# Patient Record
Sex: Female | Born: 2000 | Race: White | Hispanic: No | Smoking: Former smoker
Health system: Southern US, Community
[De-identification: ages and names within clinical notes are randomized; demographics above are authoritative.]

## PROBLEM LIST (undated history)

## (undated) DIAGNOSIS — R000 Tachycardia, unspecified: Secondary | ICD-10-CM

## (undated) DIAGNOSIS — O139 Gestational [pregnancy-induced] hypertension without significant proteinuria, unspecified trimester: Secondary | ICD-10-CM

## (undated) DIAGNOSIS — Z8759 Personal history of other complications of pregnancy, childbirth and the puerperium: Secondary | ICD-10-CM

## (undated) DIAGNOSIS — L309 Dermatitis, unspecified: Secondary | ICD-10-CM

## (undated) HISTORY — DX: Personal history of other complications of pregnancy, childbirth and the puerperium: Z87.59

## (undated) HISTORY — DX: Gestational (pregnancy-induced) hypertension without significant proteinuria, unspecified trimester: O13.9

---
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
## 2019-01-09 DIAGNOSIS — Z3201 Encounter for pregnancy test, result positive: Secondary | ICD-10-CM | POA: Diagnosis not present

## 2019-02-06 DIAGNOSIS — Z3A09 9 weeks gestation of pregnancy: Secondary | ICD-10-CM | POA: Diagnosis not present

## 2019-02-06 DIAGNOSIS — Z3689 Encounter for other specified antenatal screening: Secondary | ICD-10-CM | POA: Diagnosis not present

## 2019-02-06 DIAGNOSIS — N912 Amenorrhea, unspecified: Secondary | ICD-10-CM | POA: Diagnosis not present

## 2019-02-06 DIAGNOSIS — Z3201 Encounter for pregnancy test, result positive: Secondary | ICD-10-CM | POA: Diagnosis not present

## 2019-02-06 DIAGNOSIS — Z3682 Encounter for antenatal screening for nuchal translucency: Secondary | ICD-10-CM | POA: Diagnosis not present

## 2019-02-06 DIAGNOSIS — Z3401 Encounter for supervision of normal first pregnancy, first trimester: Secondary | ICD-10-CM | POA: Diagnosis not present

## 2019-02-06 LAB — OB RESULTS CONSOLE ANTIBODY SCREEN: Antibody Screen: NEGATIVE

## 2019-02-06 LAB — OB RESULTS CONSOLE VARICELLA ZOSTER ANTIBODY, IGG: Varicella: IMMUNE

## 2019-02-06 LAB — OB RESULTS CONSOLE RUBELLA ANTIBODY, IGM: Rubella: IMMUNE

## 2019-02-06 LAB — OB RESULTS CONSOLE GC/CHLAMYDIA
Chlamydia: NEGATIVE
Gonorrhea: NEGATIVE

## 2019-02-06 LAB — OB RESULTS CONSOLE HIV ANTIBODY (ROUTINE TESTING): HIV: NONREACTIVE

## 2019-02-06 LAB — OB RESULTS CONSOLE ABO/RH: RH Type: POSITIVE

## 2019-02-06 LAB — OB RESULTS CONSOLE HEPATITIS B SURFACE ANTIGEN: Hepatitis B Surface Ag: NEGATIVE

## 2019-03-06 DIAGNOSIS — Z3A13 13 weeks gestation of pregnancy: Secondary | ICD-10-CM | POA: Diagnosis not present

## 2019-03-06 DIAGNOSIS — Z3682 Encounter for antenatal screening for nuchal translucency: Secondary | ICD-10-CM | POA: Diagnosis not present

## 2019-03-06 DIAGNOSIS — Z23 Encounter for immunization: Secondary | ICD-10-CM | POA: Diagnosis not present

## 2019-04-03 DIAGNOSIS — Z3A18 18 weeks gestation of pregnancy: Secondary | ICD-10-CM | POA: Diagnosis not present

## 2019-04-14 DIAGNOSIS — Z36 Encounter for antenatal screening for chromosomal anomalies: Secondary | ICD-10-CM | POA: Diagnosis not present

## 2019-04-14 DIAGNOSIS — Z3A19 19 weeks gestation of pregnancy: Secondary | ICD-10-CM | POA: Diagnosis not present

## 2019-04-14 DIAGNOSIS — Z363 Encounter for antenatal screening for malformations: Secondary | ICD-10-CM | POA: Diagnosis not present

## 2019-06-13 NOTE — L&D Delivery Note (Signed)
OB/GYN Faculty Practice Delivery Note  Kendra Rose is a 19 y.o. G1P0 s/p SVD at [redacted]w[redacted]d. She was admitted for IOL for PreE with severe features.   AROM: 13h 48m with clear/white fluid GBS Status:  --Theda Sers (03/02 1426) Maximum Maternal Temperature: 98.4 F  Labor Progress: . Patient presented to L&D for IOL for PreE with severe features. Initial SVE: 1cm. Labor course was uncomplicated. She then progressed to complete.   Delivery Date/Time: 08/21/2019 @ 0145  Delivery: Called to room and patient was complete and pushing. Head position was LOA and delivered with ease over the perineum. No nuchal cord present. Shoulder and body delivered in usual fashion. Infant with spontaneous cry, placed on mother's abdomen, dried and stimulated. Cord clamped x 2 after 1-minute delay, and cut by FoB. Cord blood drawn. Placenta delivered spontaneously with gentle cord traction. Pitocin was started and fundal massage continued to produce brisk, frank blood. Misoprostol 400mg  rectal and 400mg  buccal were given. Code hemorrhage was called. She then received TXA, hemabate, and lomotil. Dr at bedside for manual exploration of the uterine cavity and produced some large clots from the lower uterine segment. After all of this and continued fundal massage/pressure, the bleeding was stopped with an EBL of ~1000cc. Labia, perineum, vagina, and cervix inspected and significant for no lacerations. Baby Weight: 3000g  Cord: central insertion, 3 vessel Placenta: Sent to L&D Complications: PPH Lacerations: None EBL: 1000cc Analgesia: Epidural   Infant: APGAR (1 MIN): 5  APGAR (5 MINS): 8 Required blow by oxygen due to amniotic fluid aspiration and was transferred to the NICU.   MD, PGY-1 OBGYN Faculty Teaching Service  08/21/2019, 2:57 AM

## 2019-06-23 ENCOUNTER — Telehealth: Payer: Self-pay | Admitting: Advanced Practice Midwife

## 2019-06-23 DIAGNOSIS — Z3400 Encounter for supervision of normal first pregnancy, unspecified trimester: Secondary | ICD-10-CM | POA: Insufficient documentation

## 2019-06-23 NOTE — Telephone Encounter (Signed)
Called patient regarding appointment scheduled in our office and advised to come alone to the visit, however, a support person, over age 20, may accompany her to appointment if assistance is needed for safety or care concerns. Otherwise, support persons should remain outside until the visit is complete.   Prescreen questions asked: 1. Any of the following symptoms of COVID such as chills, fever, cough, shortness of breath, muscle pain, diarrhea, rash, vomiting, abdominal pain, red eye, weakness, bruising, bleeding, joint pain, loss of taste or smell, a severe headache, sore throat, fatigue 2. Any exposure to anyone suspected or confirmed of having COVID-19 3. Awaiting test results for COVID-19  Also,to keep you safe, please use the provided hand sanitizer when you enter the office. We are asking everyone in the office to wear a mask to help prevent the spread of germs. If you have a mask of your own, please wear it to your appointment, if not, we are happy to provide one for you.  Thank you for understanding and your cooperation.    [facility] Staff      

## 2019-06-24 ENCOUNTER — Encounter: Payer: Self-pay | Admitting: Advanced Practice Midwife

## 2019-06-24 ENCOUNTER — Ambulatory Visit: Payer: Medicaid Other | Admitting: *Deleted

## 2019-06-24 ENCOUNTER — Ambulatory Visit (INDEPENDENT_AMBULATORY_CARE_PROVIDER_SITE_OTHER): Payer: Medicaid Other | Admitting: Advanced Practice Midwife

## 2019-06-24 ENCOUNTER — Other Ambulatory Visit: Payer: Self-pay

## 2019-06-24 ENCOUNTER — Other Ambulatory Visit: Payer: Medicaid Other

## 2019-06-24 VITALS — BP 126/80 | HR 86 | Ht 64.0 in | Wt 204.0 lb

## 2019-06-24 DIAGNOSIS — Z131 Encounter for screening for diabetes mellitus: Secondary | ICD-10-CM

## 2019-06-24 DIAGNOSIS — Z3402 Encounter for supervision of normal first pregnancy, second trimester: Secondary | ICD-10-CM

## 2019-06-24 DIAGNOSIS — Z3403 Encounter for supervision of normal first pregnancy, third trimester: Secondary | ICD-10-CM

## 2019-06-24 DIAGNOSIS — Z1389 Encounter for screening for other disorder: Secondary | ICD-10-CM

## 2019-06-24 DIAGNOSIS — Z331 Pregnant state, incidental: Secondary | ICD-10-CM

## 2019-06-24 DIAGNOSIS — Z3A29 29 weeks gestation of pregnancy: Secondary | ICD-10-CM | POA: Diagnosis not present

## 2019-06-24 MED ORDER — BLOOD PRESSURE MONITOR MISC: 0 refills | Status: DC

## 2019-06-24 NOTE — Patient Instructions (Signed)
Manual Meier, I greatly value your feedback.  If you receive a survey following your visit with [place] today, we appreciate you taking the time to fill it out.  Thanks, Philipp Deputy, CNM  [facility]ITAL HAS MOVED!!! It is now [facility] at [facility] ([address] [place], [place] [address]) Entrance located off of [address] Free 24/7 valet parking   Nausea & Vomiting  Have saltine crackers or pretzels by your bed and eat a few bites before you raise your head out of bed in the morning  Eat small frequent meals throughout the day instead of large meals  Drink plenty of fluids throughout the day to stay hydrated, just don't drink a lot of fluids with your meals.  This can make your stomach fill up faster making you feel sick  Do not brush your teeth right after you eat  Products with real ginger are good for nausea, like ginger ale and ginger hard candy Make sure it says made with real ginger!  Sucking on sour candy like lemon heads is also good for nausea  If your prenatal vitamins make you nauseated, take them at night so you will sleep through the nausea  Sea Bands  If you feel like you need medicine for the nausea & vomiting please let us know  If you are unable to keep any fluids or food down please let us know   Constipation  Drink plenty of fluid, preferably water, throughout the day  Eat foods high in fiber such as fruits, vegetables, and grains  Exercise, such as walking, is a good way to keep your bowels regular  Drink warm fluids, especially warm prune juice, or decaf coffee  Eat a 1/2 cup of real oatmeal (not instant), 1/2 cup applesauce, and 1/2-1 cup warm prune juice every day  If needed, you may take Colace (docusate sodium) stool softener once or twice a day to help keep the stool soft.   If you still are having problems with constipation, you may take Miralax once daily as needed to help keep your bowels regular.   Home Blood Pressure  Monitoring for Patients   Your provider has recommended that you check your blood pressure (BP) at least once a week at home. If you do not have a blood pressure cuff at home, one will be provided for you. Contact your provider if you have not received your monitor within 1 week.   Helpful Tips for Accurate Home Blood Pressure Checks  . Don't smoke, exercise, or drink caffeine 30 minutes before checking your BP . Use the restroom before checking your BP (a full bladder can raise your pressure) . Relax in a comfortable upright chair . Feet on the ground . Left arm resting comfortably on a flat surface at the level of your heart . Legs uncrossed . Back supported . Sit quietly and don't talk . Place the cuff on your bare arm . Adjust snuggly, so that only two fingertips can fit between your skin and the top of the cuff . Check 2 readings separated by at least one minute . Keep a log of your BP readings . For a visual, please reference this diagram: http://ccnc.care/bpdiagram  Provider Name: [facility]     Phone: [phone]  Zone 1: ALL CLEAR  Continue to monitor your symptoms:  . BP reading is less than 140 (top number) or less than 90 (bottom number)  . No right upper stomach pain .  No headaches or seeing spots . No feeling nauseated or throwing up . No swelling in face and hands  Zone 2: CAUTION Call your doctor's office for any of the following:  . BP reading is greater than 140 (top number) or greater than 90 (bottom number)  . Stomach pain under your ribs in the middle or right side . Headaches or seeing spots . Feeling nauseated or throwing up . Swelling in face and hands  Zone 3: EMERGENCY  Seek immediate medical care if you have any of the following:  . BP reading is greater than160 (top number) or greater than 110 (bottom number) . Severe headaches not improving with Tylenol . Serious difficulty catching your breath . Any worsening symptoms from Zone 2     First Trimester of Pregnancy The first trimester of pregnancy is from week 1 until the end of week 12 (months 1 through 3). A week after a sperm fertilizes an egg, the egg will implant on the wall of the uterus. This embryo will begin to develop into a baby. Genes from you and your partner are forming the baby. The female genes determine whether the baby is a boy or a girl. At 6-8 weeks, the eyes and face are formed, and the heartbeat can be seen on ultrasound. At the end of 12 weeks, all the baby's organs are formed.  Now that you are pregnant, you will want to do everything you can to have a healthy baby. Two of the most important things are to get good prenatal care and to follow your health care provider's instructions. Prenatal care is all the medical care you receive before the baby's birth. This care will help prevent, find, and treat any problems during the pregnancy and childbirth. BODY CHANGES Your body goes through many changes during pregnancy. The changes vary from woman to woman.   You may gain or lose a couple of pounds at first.  You may feel sick to your stomach (nauseous) and throw up (vomit). If the vomiting is uncontrollable, call your health care provider.  You may tire easily.  You may develop headaches that can be relieved by medicines approved by your health care provider.  You may urinate more often. Painful urination may mean you have a bladder infection.  You may develop heartburn as a result of your pregnancy.  You may develop constipation because certain hormones are causing the muscles that push waste through your intestines to slow down.  You may develop hemorrhoids or swollen, bulging veins (varicose veins).  Your breasts may begin to grow larger and become tender. Your nipples may stick out more, and the tissue that surrounds them (areola) may become darker.  Your gums may bleed and may be sensitive to brushing and flossing.  Dark spots or blotches (chloasma,  mask of pregnancy) may develop on your face. This will likely fade after the baby is born.  Your menstrual periods will stop.  You may have a loss of appetite.  You may develop cravings for certain kinds of food.  You may have changes in your emotions from day to day, such as being excited to be pregnant or being concerned that something may go wrong with the pregnancy and baby.  You may have more vivid and strange dreams.  You may have changes in your hair. These can include thickening of your hair, rapid growth, and changes in texture. Some women also have hair loss during or after pregnancy, or hair that feels dry  or thin. Your hair will most likely return to normal after your baby is born. WHAT TO EXPECT AT YOUR PRENATAL VISITS During a routine prenatal visit:  You will be weighed to make sure you and the baby are growing normally.  Your blood pressure will be taken.  Your abdomen will be measured to track your baby's growth.  The fetal heartbeat will be listened to starting around week 10 or 12 of your pregnancy.  Test results from any previous visits will be discussed. Your health care provider may ask you:  How you are feeling.  If you are feeling the baby move.  If you have had any abnormal symptoms, such as leaking fluid, bleeding, severe headaches, or abdominal cramping.  If you have any questions. Other tests that may be performed during your first trimester include:  Blood tests to find your blood type and to check for the presence of any previous infections. They will also be used to check for low iron levels (anemia) and Rh antibodies. Later in the pregnancy, blood tests for diabetes will be done along with other tests if problems develop.  Urine tests to check for infections, diabetes, or protein in the urine.  An ultrasound to confirm the proper growth and development of the baby.  An amniocentesis to check for possible genetic problems.  Fetal screens for  spina bifida and Down syndrome.  You may need other tests to make sure you and the baby are doing well. HOME CARE INSTRUCTIONS  Medicines  Follow your health care provider's instructions regarding medicine use. Specific medicines may be either safe or unsafe to take during pregnancy.  Take your prenatal vitamins as directed.  If you develop constipation, try taking a stool softener if your health care provider approves. Diet  Eat regular, well-balanced meals. Choose a variety of foods, such as meat or vegetable-based protein, fish, milk and low-fat dairy products, vegetables, fruits, and whole grain breads and cereals. Your health care provider will help you determine the amount of weight gain that is right for you.  Avoid raw meat and uncooked cheese. These carry germs that can cause birth defects in the baby.  Eating four or five small meals rather than three large meals a day may help relieve nausea and vomiting. If you start to feel nauseous, eating a few soda crackers can be helpful. Drinking liquids between meals instead of during meals also seems to help nausea and vomiting.  If you develop constipation, eat more high-fiber foods, such as fresh vegetables or fruit and whole grains. Drink enough fluids to keep your urine clear or pale yellow. Activity and Exercise  Exercise only as directed by your health care provider. Exercising will help you:  Control your weight.  Stay in shape.  Be prepared for labor and delivery.  Experiencing pain or cramping in the lower abdomen or low back is a good sign that you should stop exercising. Check with your health care provider before continuing normal exercises.  Try to avoid standing for long periods of time. Move your legs often if you must stand in one place for a long time.  Avoid heavy lifting.  Wear low-heeled shoes, and practice good posture.  You may continue to have sex unless your health care provider directs you  otherwise. Relief of Pain or Discomfort  Wear a good support bra for breast tenderness.    Take warm sitz baths to soothe any pain or discomfort caused by hemorrhoids. Use hemorrhoid cream if your  health care provider approves.    Rest with your legs elevated if you have leg cramps or low back pain.  If you develop varicose veins in your legs, wear support hose. Elevate your feet for 15 minutes, 3-4 times a day. Limit salt in your diet. Prenatal Care  Schedule your prenatal visits by the twelfth week of pregnancy. They are usually scheduled monthly at first, then more often in the last 2 months before delivery.  Write down your questions. Take them to your prenatal visits.  Keep all your prenatal visits as directed by your health care provider. Safety  Wear your seat belt at all times when driving.  Make a list of emergency phone numbers, including numbers for family, friends, the hospital, and police and fire departments. General Tips  Ask your health care provider for a referral to a local prenatal education class. Begin classes no later than at the beginning of month 6 of your pregnancy.  Ask for help if you have counseling or nutritional needs during pregnancy. Your health care provider can offer advice or refer you to specialists for help with various needs.  Do not use hot tubs, steam rooms, or saunas.  Do not douche or use tampons or scented sanitary pads.  Do not cross your legs for long periods of time.  Avoid cat litter boxes and soil used by cats. These carry germs that can cause birth defects in the baby and possibly loss of the fetus by miscarriage or stillbirth.  Avoid all smoking, herbs, alcohol, and medicines not prescribed by your health care provider. Chemicals in these affect the formation and growth of the baby.  Schedule a dentist appointment. At home, brush your teeth with a soft toothbrush and be gentle when you floss. SEEK MEDICAL CARE IF:   You have  dizziness.  You have mild pelvic cramps, pelvic pressure, or nagging pain in the abdominal area.  You have persistent nausea, vomiting, or diarrhea.  You have a bad smelling vaginal discharge.  You have pain with urination.  You notice increased swelling in your face, hands, legs, or ankles. SEEK IMMEDIATE MEDICAL CARE IF:   You have a fever.  You are leaking fluid from your vagina.  You have spotting or bleeding from your vagina.  You have severe abdominal cramping or pain.  You have rapid weight gain or loss.  You vomit blood or material that looks like coffee grounds.  You are exposed to [place] measles and have never had them.  You are exposed to fifth disease or chickenpox.  You develop a severe headache.  You have shortness of breath.  You have any kind of trauma, such as from a fall or a car accident. Document Released: 05/23/2001 Document Revised: 10/13/2013 Document Reviewed: 04/08/2013 [facility] Patient Information 2015 [place], [place]. This information is not intended to replace advice given to you by your health care provider. Make sure you discuss any questions you have with your health care provider.  Coronavirus (COVID-19) Are you at risk?  Are you at risk for the Coronavirus (COVID-19)?  To be considered HIGH RISK for Coronavirus (COVID-19), you have to meet the following criteria:  . Traveled to [place], [place], [place], [place] or [place]; or in the [place] to [place], [place], [place], or [place]; and have fever, cough, and shortness of breath within the last 2 weeks of travel OR . Been in close contact with a person diagnosed with COVID-19 within the last 2 weeks and  have fever, cough, and shortness of breath . IF YOU DO NOT MEET THESE CRITERIA, YOU ARE CONSIDERED LOW RISK FOR COVID-19.  What to do if you are HIGH RISK for COVID-19?  Marland Kitchen If you are having a medical emergency, call 911. . Seek medical care right away. Before you go to a  doctor's office, urgent care or emergency department, call ahead and tell them about your recent travel, contact with someone diagnosed with COVID-19, and your symptoms. You should receive instructions from your physician's office regarding next steps of care.  . When you arrive at healthcare provider, tell the healthcare staff immediately you have returned from visiting [place], [place], [place], [place] or [place]; or traveled in the [place] to [place], [place], [place], or [place]; in the last two weeks or you have been in close contact with a person diagnosed with COVID-19 in the last 2 weeks.   . Tell the health care staff about your symptoms: fever, cough and shortness of breath. . After you have been seen by a medical provider, you will be either: o Tested for (COVID-19) and discharged home on quarantine except to seek medical care if symptoms worsen, and asked to  - Stay home and avoid contact with others until you get your results (4-5 days)  - Avoid travel on public transportation if possible (such as bus, train, or airplane) or o Sent to the Emergency Department by EMS for evaluation, COVID-19 testing, and possible admission depending on your condition and test results.  What to do if you are LOW RISK for COVID-19?  Reduce your risk of any infection by using the same precautions used for avoiding the common cold or flu:  Marland Kitchen Wash your hands often with soap and warm water for at least 20 seconds.  If soap and water are not readily available, use an alcohol-based hand sanitizer with at least 60% alcohol.  . If coughing or sneezing, cover your mouth and nose by coughing or sneezing into the elbow areas of your shirt or coat, into a tissue or into your sleeve (not your hands). . Avoid shaking hands with others and consider head nods or verbal greetings only. . Avoid touching your eyes, nose, or mouth with unwashed hands.  . Avoid close contact with people who are sick. . Avoid  places or events with large numbers of people in one location, like concerts or sporting events. . Carefully consider travel plans you have or are making. . If you are planning any travel outside or inside the [place], visit the CDC's Travelers' Health webpage for the latest health notices. . If you have some symptoms but not all symptoms, continue to monitor at home and seek medical attention if your symptoms worsen. . If you are having a medical emergency, call 911.   [place] / e-Visit: [email or web address]         [facility]: [facility]: [id number]                   [facility]: [phone]

## 2019-06-24 NOTE — Progress Notes (Signed)
INITIAL OBSTETRICAL VISIT Patient name: Kendra Rose MRN [id number]  Date of birth: December 11, 2000 Chief Complaint:   Initial Prenatal Visit (transfer from [facility] [place])  History of Present Illness:   Kendra Rose is a 19 y.o. G19P0 Caucasian female at [redacted]w[redacted]d by LMP with an Estimated Date of Delivery: 09/06/19 being seen today for initial obstetrical visit as a tx of care from [place].   Her obstetrical history is significant for prev smoker (stopped with preg).   Today she reports hip discomfort.  Patient's last menstrual period was 11/30/2018. Last pap <21years.  Review of Systems:   Pertinent items are noted in HPI Denies cramping/contractions, leakage of fluid, vaginal bleeding, abnormal vaginal discharge w/ itching/odor/irritation, headaches, visual changes, shortness of breath, chest pain, abdominal pain, severe nausea/vomiting, or problems with urination or bowel movements unless otherwise stated above.  Pertinent History Reviewed:  Reviewed past medical,surgical, social, obstetrical and family history.  Reviewed problem list, medications and allergies. OB History  Gravida Para Term Preterm AB Living  1            SAB TAB Ectopic Multiple Live Births               # Outcome Date GA Lbr Len/2nd Weight Sex Delivery Anes PTL Lv  1 Current            Physical Assessment:   Vitals:   06/24/19 0954 06/24/19 0959  BP: 126/80   Pulse: 86   Weight: 204 lb (92.5 kg)   Height:  5\' 4"  (1.626 m)  Body mass index is 35.02 kg/m.       Physical Examination:  General appearance - well appearing, and in no distress  Mental status - alert, oriented to person, place, and time  Psych:  She has a normal mood and affect  Skin - warm and dry, normal color, no suspicious lesions noted  Chest - effort normal, all lung fields clear to auscultation bilaterally  Heart - normal rate and regular rhythm  Abdomen - soft, nontender  Extremities:  No swelling or varicosities noted  Pelvic - exam not  indicated  Thin prep pap is not done   No results found for this or any previous visit (from the past 24 hour(s)).  Assessment & Plan:  1) Low-Risk Pregnancy G1P0 at [redacted]w[redacted]d with an Estimated Date of Delivery: 09/06/19   2) Initial OB visit as a transfer of care> prenatal records reviewed (only concern was +RPR with neg TP)  3) Moving to [place] in Feb> wants to stay [facility] so will either tx to [facility] or [facility] most likely  Meds:  Meds ordered this encounter  Medications  . Blood Pressure Monitor MISC    Sig: For regular home bp monitoring during pregnancy    Dispense:  1 each    Refill:  0    Z34.03    Initial labs obtained Continue prenatal vitamins Reviewed n/v relief measures and warning s/s to report Reviewed recommended weight gain based on pre-gravid BMI Encouraged well-balanced diet Genetic Screening discussed: results reviewed- neg OSB Cystic fibrosis, SMA, Fragile X screening discussed declined Ultrasound discussed; fetal survey: requested CCNC completed>PCM not here, form faxed The nature of [place] for [employer] with multiple MDs and other Advanced Practice Providers was explained to patient; also emphasized that fellows, residents, and students are part of our team. Needs home bp cuff. Check bp weekly, let us know if >140/90.    Follow-up: Return in about 2  weeks (around 07/08/2019) for LROB, in person.   Orders Placed This Encounter  Procedures  . OB RESULTS CONSOLE GC/Chlamydia  . OB RESULTS CONSOLE HIV antibody  . OB RESULTS CONSOLE Rubella Antibody  . OB RESULTS CONSOLE Varicella zoster antibody, IgG  . OB RESULTS CONSOLE Hepatitis B surface antigen  . POC Urinalysis Dipstick OB  . OB RESULTS CONSOLE ABO/Rh  . OB RESULTS CONSOLE Antibody Screen    [name] [facility] 06/24/2019 11:12 AM

## 2019-06-25 LAB — GLUCOSE TOLERANCE, 2 HOURS W/ 1HR
Glucose, 1 hour: 152 mg/dL (ref 65–179)
Glucose, 2 hour: 120 mg/dL (ref 65–152)
Glucose, Fasting: 84 mg/dL (ref 65–91)

## 2019-06-25 LAB — CBC
Hematocrit: 34.2 % (ref 34.0–46.6)
Hemoglobin: 11.2 g/dL (ref 11.1–15.9)
MCH: 27.5 pg (ref 26.6–33.0)
MCHC: 32.7 g/dL (ref 31.5–35.7)
MCV: 84 fL (ref 79–97)
Platelets: 193 10*3/uL (ref 150–450)
RBC: 4.08 x10E6/uL (ref 3.77–5.28)
RDW: 13.1 % (ref 11.7–15.4)
WBC: 12.5 10*3/uL — ABNORMAL HIGH (ref 3.4–10.8)

## 2019-06-25 LAB — ANTIBODY SCREEN: Antibody Screen: NEGATIVE

## 2019-06-25 LAB — HIV ANTIBODY (ROUTINE TESTING W REFLEX): HIV Screen 4th Generation wRfx: NONREACTIVE

## 2019-06-25 LAB — RPR: RPR Ser Ql: NONREACTIVE

## 2019-07-07 ENCOUNTER — Telehealth: Payer: Self-pay | Admitting: Advanced Practice Midwife

## 2019-07-07 NOTE — Telephone Encounter (Signed)
Called patient regarding appointment and the following message was left: ° ° °We have you scheduled for an upcoming appointment at our office. At this time, we are still not allowing visitors during the appointment, however, a support person, over age 20, may accompany you to your appointment if assistance is needed for safety or care concerns. Otherwise, support persons should remain outside until the visit is complete.  ° °We ask if you are sick, have any symptoms of COVID, have had any exposure to anyone suspected or confirmed of having COVID-19, or are awaiting test results for COVID-19, to call our office as we may need to reschedule you for a virtual visit or schedule your appointment for a later date.   ° °Please know we will ask you these questions or similar questions when you arrive for your appointment and understand this is how we are keeping everyone safe.   ° °Also,to keep you safe, please use the provided hand sanitizer when you enter the office. We are asking everyone in the office to wear a mask to help prevent the spread of °germs. If you have a mask of your own, please wear it to your appointment, if not, we are happy to provide one for you. ° °Thank you for understanding and your cooperation.  ° ° °[facility] Staff ° ° ° ° ° °

## 2019-07-08 ENCOUNTER — Encounter: Payer: Self-pay | Admitting: Advanced Practice Midwife

## 2019-07-08 ENCOUNTER — Ambulatory Visit (INDEPENDENT_AMBULATORY_CARE_PROVIDER_SITE_OTHER): Payer: Medicaid Other | Admitting: Advanced Practice Midwife

## 2019-07-08 ENCOUNTER — Other Ambulatory Visit: Payer: Self-pay

## 2019-07-08 VITALS — BP 125/74 | HR 107 | Wt 206.5 lb

## 2019-07-08 DIAGNOSIS — Z1389 Encounter for screening for other disorder: Secondary | ICD-10-CM

## 2019-07-08 DIAGNOSIS — Z331 Pregnant state, incidental: Secondary | ICD-10-CM

## 2019-07-08 DIAGNOSIS — Z3403 Encounter for supervision of normal first pregnancy, third trimester: Secondary | ICD-10-CM

## 2019-07-08 DIAGNOSIS — Z3A31 31 weeks gestation of pregnancy: Secondary | ICD-10-CM

## 2019-07-08 DIAGNOSIS — Z23 Encounter for immunization: Secondary | ICD-10-CM

## 2019-07-08 LAB — POCT URINALYSIS DIPSTICK OB
Glucose, UA: NEGATIVE
Ketones, UA: NEGATIVE
Nitrite, UA: NEGATIVE

## 2019-07-08 NOTE — Progress Notes (Signed)
   LOW-RISK PREGNANCY VISIT Patient name: Kendra Rose MRN [id number]  Date of birth: 09/05/2000 Chief Complaint:   Routine Prenatal Visit  History of Present Illness:   Kendra Rose is a 19 y.o. G1P0 female at [redacted]w[redacted]d with an Estimated Date of Delivery: 09/06/19 being seen today for ongoing management of a low-risk pregnancy.  Today she reports no complaints. Contractions: Not present. Vag. Bleeding: None.  Movement: Present. denies leaking of fluid. Review of Systems:   Pertinent items are noted in HPI Denies abnormal vaginal discharge w/ itching/odor/irritation, headaches, visual changes, shortness of breath, chest pain, abdominal pain, severe nausea/vomiting, or problems with urination or bowel movements unless otherwise stated above. Pertinent History Reviewed:  Reviewed past medical,surgical, social, obstetrical and family history.  Reviewed problem list, medications and allergies. Physical Assessment:   Vitals:   07/08/19 1626  BP: 125/74  Pulse: (!) 107  Weight: 206 lb 8 oz (93.7 kg)  Body mass index is 35.45 kg/m.        Physical Examination:   General appearance: Well appearing, and in no distress  Mental status: Alert, oriented to person, place, and time  Skin: Warm & dry  Cardiovascular: Normal heart rate noted  Respiratory: Normal respiratory effort, no distress  Abdomen: Soft, gravid, nontender  Pelvic: Cervical exam deferred         Extremities: Edema: Trace  Fetal Status: Fetal Heart Rate (bpm): 143 Fundal Height: 30 cm Movement: Present    Results for orders placed or performed in visit on 07/08/19 (from the past 24 hour(s))  POC Urinalysis Dipstick OB   Collection Time: 07/08/19  4:26 PM  Result Value Ref Range   Color, UA     Clarity, UA     Glucose, UA Negative Negative   Bilirubin, UA     Ketones, UA neg    Spec Grav, UA     Blood, UA trace    pH, UA     POC,PROTEIN,UA Trace Negative, Trace, Small (1+), Moderate (2+), Large (3+), 4+   Urobilinogen, UA      Nitrite, UA neg    Leukocytes, UA Trace (A) Negative   Appearance     Odor      Assessment & Plan:  1) Low-risk pregnancy G1P0 at [redacted]w[redacted]d with an Estimated Date of Delivery: 09/06/19   2) Moving to [place] in Feb, will plan on transferring care to another [facility] office (msg to [place])   Meds: No orders of the defined types were placed in this encounter.  Labs/procedures today: none  Plan:  Continue routine obstetrical care   Reviewed: Preterm labor symptoms and general obstetric precautions including but not limited to vaginal bleeding, contractions, leaking of fluid and fetal movement were reviewed in detail with the patient.  All questions were answered. Has home bp cuff.  Check bp weekly, let us know if >140/90.   Follow-up: Return for LROB, MyChart video; 2-3 wks.  Orders Placed This Encounter  Procedures  . Tdap vaccine greater than or equal to 7yo IM  . POC Urinalysis Dipstick OB   [name] [facility] 07/08/2019 4:47 PM

## 2019-07-08 NOTE — Patient Instructions (Signed)
Manual Meier, I greatly value your feedback.  If you receive a survey following your visit with [place] today, we appreciate you taking the time to fill it out.  Thanks, Philipp Deputy, CNM  [facility] HOSPITAL HAS MOVED!!! It is now [facility] at [facility] ([address] [place], [place] [address]) Entrance located off of [address] Free 24/7 valet parking    Go to [email or web address] to register for FREE online childbirth classes   Call the office [phone]) or go to [facility] if:  You begin to have strong, frequent contractions  Your water breaks.  Sometimes it is a big gush of fluid, sometimes it is just a trickle that keeps getting your panties wet or running down your legs  You have vaginal bleeding.  It is normal to have a small amount of spotting if your cervix was checked.   You don't feel your baby moving like normal.  If you don't, get you something to eat and drink and lay down and focus on feeling your baby move.  You should feel at least 10 movements in 2 hours.  If you don't, you should call the office or go to [facility].    Tdap Vaccine  It is recommended that you get the Tdap vaccine during the third trimester of EACH pregnancy to help protect your baby from getting pertussis (whooping cough)  27-36 weeks is the BEST time to do this so that you can pass the protection on to your baby. During pregnancy is better than after pregnancy, but if you are unable to get it during pregnancy it will be offered at the hospital.   You can get this vaccine with [place], at the health department, your family doctor, or some local pharmacies  Everyone who will be around your baby should also be up-to-date on their vaccines before the baby comes. Adults (who are not pregnant) only need 1 dose of Tdap during adulthood.   [place] Pediatricians/Family Doctors:  [facility] [phone]            [facility] [phone]                  [facility] [phone] (usually not accepting new patients unless you have family there already, you are always welcome to call and ask)       [facility] Department [phone]       [facility] Pediatricians/Family Doctors:   [facility]: [phone]  [facility]: [phone]  [facility]: [phone]  [facility] Doctors:   [facility]: [phone]   [facility]: [phone]  [facility] Doctors:  [facility]: [phone]   Home Blood Pressure Monitoring for Patients   Your provider has recommended that you check your blood pressure (BP) at least once a week at home. If you do not have a blood pressure cuff at home, one will be provided for you. Contact your provider if you have not received your monitor within 1 week.   Helpful Tips for Accurate Home Blood Pressure Checks  . Don't smoke, exercise, or drink caffeine 30 minutes before checking your BP . Use the restroom before checking your BP (a full bladder can raise your pressure) . Relax in a comfortable upright chair . Feet on the ground . Left arm resting comfortably on a flat surface at the level of your heart . Legs uncrossed . Back supported . Sit quietly and don't talk .  Place the cuff on your bare arm . Adjust snuggly, so that only two fingertips can fit between your skin and the top of the cuff . Check 2 readings separated by at least one minute . Keep a log of your BP readings . For a visual, please reference this diagram: http://ccnc.care/bpdiagram  Provider Name: [facility]     Phone: [phone]  Zone 1: ALL CLEAR  Continue to monitor your symptoms:  . BP reading is less than 140 (top number) or less than 90 (bottom number)  . No right upper stomach pain . No headaches or seeing spots . No feeling nauseated or throwing up . No swelling in face and  hands  Zone 2: CAUTION Call your doctor's office for any of the following:  . BP reading is greater than 140 (top number) or greater than 90 (bottom number)  . Stomach pain under your ribs in the middle or right side . Headaches or seeing spots . Feeling nauseated or throwing up . Swelling in face and hands  Zone 3: EMERGENCY  Seek immediate medical care if you have any of the following:  . BP reading is greater than160 (top number) or greater than 110 (bottom number) . Severe headaches not improving with Tylenol . Serious difficulty catching your breath . Any worsening symptoms from Zone 2   Third Trimester of Pregnancy The third trimester is from week 29 through week 42, months 7 through 9. The third trimester is a time when the fetus is growing rapidly. At the end of the ninth month, the fetus is about 20 inches in length and weighs 6-10 pounds.  BODY CHANGES Your body goes through many changes during pregnancy. The changes vary from woman to woman.   Your weight will continue to increase. You can expect to gain 25-35 pounds (11-16 kg) by the end of the pregnancy.  You may begin to get stretch marks on your hips, abdomen, and breasts.  You may urinate more often because the fetus is moving lower into your pelvis and pressing on your bladder.  You may develop or continue to have heartburn as a result of your pregnancy.  You may develop constipation because certain hormones are causing the muscles that push waste through your intestines to slow down.  You may develop hemorrhoids or swollen, bulging veins (varicose veins).  You may have pelvic pain because of the weight gain and pregnancy hormones relaxing your joints between the bones in your pelvis. Backaches may result from overexertion of the muscles supporting your posture.  You may have changes in your hair. These can include thickening of your hair, rapid growth, and changes in texture. Some women also have hair loss during  or after pregnancy, or hair that feels dry or thin. Your hair will most likely return to normal after your baby is born.  Your breasts will continue to grow and be tender. A yellow discharge may leak from your breasts called colostrum.  Your belly button may stick out.  You may feel short of breath because of your expanding uterus.  You may notice the fetus "dropping," or moving lower in your abdomen.  You may have a bloody mucus discharge. This usually occurs a few days to a week before labor begins.  Your cervix becomes thin and soft (effaced) near your due date. WHAT TO EXPECT AT YOUR PRENATAL EXAMS  You will have prenatal exams every 2 weeks until week 36. Then, you will have weekly prenatal exams. During  a routine prenatal visit:  You will be weighed to make sure you and the fetus are growing normally.  Your blood pressure is taken.  Your abdomen will be measured to track your baby's growth.  The fetal heartbeat will be listened to.  Any test results from the previous visit will be discussed.  You may have a cervical check near your due date to see if you have effaced. At around 36 weeks, your caregiver will check your cervix. At the same time, your caregiver will also perform a test on the secretions of the vaginal tissue. This test is to determine if a type of bacteria, Group B streptococcus, is present. Your caregiver will explain this further. Your caregiver may ask you:  What your birth plan is.  How you are feeling.  If you are feeling the baby move.  If you have had any abnormal symptoms, such as leaking fluid, bleeding, severe headaches, or abdominal cramping.  If you have any questions. Other tests or screenings that may be performed during your third trimester include:  Blood tests that check for low iron levels (anemia).  Fetal testing to check the health, activity level, and growth of the fetus. Testing is done if you have certain medical conditions or if  there are problems during the pregnancy. FALSE LABOR You may feel small, irregular contractions that eventually go away. These are called Braxton Hicks contractions, or false labor. Contractions may last for hours, days, or even weeks before true labor sets in. If contractions come at regular intervals, intensify, or become painful, it is best to be seen by your caregiver.  SIGNS OF LABOR   Menstrual-like cramps.  Contractions that are 5 minutes apart or less.  Contractions that start on the top of the uterus and spread down to the lower abdomen and back.  A sense of increased pelvic pressure or back pain.  A watery or bloody mucus discharge that comes from the vagina. If you have any of these signs before the 37th week of pregnancy, call your caregiver right away. You need to go to the hospital to get checked immediately. HOME CARE INSTRUCTIONS   Avoid all smoking, herbs, alcohol, and unprescribed drugs. These chemicals affect the formation and growth of the baby.  Follow your caregiver's instructions regarding medicine use. There are medicines that are either safe or unsafe to take during pregnancy.  Exercise only as directed by your caregiver. Experiencing uterine cramps is a good sign to stop exercising.  Continue to eat regular, healthy meals.  Wear a good support bra for breast tenderness.  Do not use hot tubs, steam rooms, or saunas.  Wear your seat belt at all times when driving.  Avoid raw meat, uncooked cheese, cat litter boxes, and soil used by cats. These carry germs that can cause birth defects in the baby.  Take your prenatal vitamins.  Try taking a stool softener (if your caregiver approves) if you develop constipation. Eat more high-fiber foods, such as fresh vegetables or fruit and whole grains. Drink plenty of fluids to keep your urine clear or pale yellow.  Take warm sitz baths to soothe any pain or discomfort caused by hemorrhoids. Use hemorrhoid cream if your  caregiver approves.  If you develop varicose veins, wear support hose. Elevate your feet for 15 minutes, 3-4 times a day. Limit salt in your diet.  Avoid heavy lifting, wear low heal shoes, and practice good posture.  Rest a lot with your legs elevated if you  have leg cramps or low back pain.  Visit your dentist if you have not gone during your pregnancy. Use a soft toothbrush to brush your teeth and be gentle when you floss.  A sexual relationship may be continued unless your caregiver directs you otherwise.  Do not travel far distances unless it is absolutely necessary and only with the approval of your caregiver.  Take prenatal classes to understand, practice, and ask questions about the labor and delivery.  Make a trial run to the hospital.  Pack your hospital bag.  Prepare the baby's nursery.  Continue to go to all your prenatal visits as directed by your caregiver. SEEK MEDICAL CARE IF:  You are unsure if you are in labor or if your water has broken.  You have dizziness.  You have mild pelvic cramps, pelvic pressure, or nagging pain in your abdominal area.  You have persistent nausea, vomiting, or diarrhea.  You have a bad smelling vaginal discharge.  You have pain with urination. SEEK IMMEDIATE MEDICAL CARE IF:   You have a fever.  You are leaking fluid from your vagina.  You have spotting or bleeding from your vagina.  You have severe abdominal cramping or pain.  You have rapid weight loss or gain.  You have shortness of breath with chest pain.  You notice sudden or extreme swelling of your face, hands, ankles, feet, or legs.  You have not felt your baby move in over an hour.  You have severe headaches that do not go away with medicine.  You have vision changes. Document Released: 05/23/2001 Document Revised: 06/03/2013 Document Reviewed: 07/30/2012 [facility] Patient Information 2015 [place], [place]. This information is not intended to replace advice  given to you by your health care provider. Make sure you discuss any questions you have with your health care provider.  PROTECT YOURSELF & YOUR BABY FROM THE FLU! Because you are pregnant, we at [facility], along with the Centers for Disease Control (CDC), recommend that you receive the flu vaccine to protect yourself and your baby from the flu. The flu is more likely to cause severe illness in pregnant women than in women of reproductive age who are not pregnant. Changes in the immune system, heart, and lungs during pregnancy make pregnant women (and women up to two weeks postpartum) more prone to severe illness from flu, including illness resulting in hospitalization. Flu also may be harmful for a pregnant woman's developing baby. A common flu symptom is fever, which may be associated with neural tube defects and other adverse outcomes for a developing baby. Getting vaccinated can also help protect a baby after birth from flu. (Mom passes antibodies onto the developing baby during her pregnancy.)  A Flu Vaccine is the Best Protection Against Flu Getting a flu vaccine is the first and most important step in protecting against flu. Pregnant women should get a flu shot and not the live attenuated influenza vaccine (LAIV), also known as nasal spray flu vaccine. Flu vaccines given during pregnancy help protect both the mother and her baby from flu. Vaccination has been shown to reduce the risk of flu-associated acute respiratory infection in pregnant women by up to one-half. A 2018 study showed that getting a flu shot reduced a pregnant woman's risk of being hospitalized with flu by an average of 40 percent. Pregnant women who get a flu vaccine are also helping to protect their babies from flu illness for the first several months after their birth, when they are too  young to get vaccinated.   A Long Record of Safety for Flu Shots in Pregnant Women Flu shots have been given to millions of pregnant women over  many years with a good safety record. There is a lot of evidence that flu vaccines can be given safely during pregnancy; though these data are limited for the first trimester. The CDC recommends that pregnant women get vaccinated during any trimester of their pregnancy. It is very important for pregnant women to get the flu shot.   Other Preventive Actions In addition to getting a flu shot, pregnant women should take the same everyday preventive actions the CDC recommends of everyone, including covering coughs, washing hands often, and avoiding people who are sick.  Symptoms and Treatment If you get sick with flu symptoms call your doctor right away. There are antiviral drugs that can treat flu illness and prevent serious flu complications. The CDC recommends prompt treatment for people who have influenza infection or suspected influenza infection and who are at high risk of serious flu complications, such as people with asthma, diabetes (including gestational diabetes), or heart disease. Early treatment of influenza in hospitalized pregnant women has been shown to reduce the length of the hospital stay.  Symptoms Flu symptoms include fever, cough, sore throat, runny or stuffy nose, body aches, headache, chills and fatigue. Some people may also have vomiting and diarrhea. People may be infected with the flu and have respiratory symptoms without a fever.  Early Treatment is Important for Pregnant Women Treatment should begin as soon as possible because antiviral drugs work best when started early (within 48 hours after symptoms start). Antiviral drugs can make your flu illness milder and make you feel better faster. They may also prevent serious health problems that can result from flu illness. Oral oseltamivir (Tamiflu) is the preferred treatment for pregnant women because it has the most studies available to suggest that it is safe and beneficial. Antiviral drugs require a prescription from your  provider. Having a fever caused by flu infection or other infections early in pregnancy may be linked to birth defects in a baby. In addition to taking antiviral drugs, pregnant women who get a fever should treat their fever with Tylenol (acetaminophen) and contact their provider immediately.  When to [place] If you are pregnant and have any of these signs, seek care immediately:  Difficulty breathing or shortness of breath  Pain or pressure in the chest or abdomen  Sudden dizziness  Confusion  Severe or persistent vomiting  High fever that is not responding to Tylenol (or store brand equivalent)  Decreased or no movement of your baby  [email or web address]

## 2019-07-29 ENCOUNTER — Telehealth (INDEPENDENT_AMBULATORY_CARE_PROVIDER_SITE_OTHER): Payer: Medicaid Other | Admitting: Advanced Practice Midwife

## 2019-07-29 ENCOUNTER — Encounter: Payer: Self-pay | Admitting: Advanced Practice Midwife

## 2019-07-29 VITALS — BP 142/67 | HR 107 | Ht 64.0 in

## 2019-07-29 DIAGNOSIS — Z3403 Encounter for supervision of normal first pregnancy, third trimester: Secondary | ICD-10-CM

## 2019-07-29 DIAGNOSIS — Z3A34 34 weeks gestation of pregnancy: Secondary | ICD-10-CM

## 2019-07-29 NOTE — Progress Notes (Signed)
   TELEHEALTH VIRTUAL OBSTETRICS VISIT ENCOUNTER NOTE Patient name: Kendra Rose MRN [id number]  Date of birth: Apr 26, 2001  I connected with patient on 07/29/19 at  2:00 PM EST by MyChart and verified that I am speaking with the correct person using two identifiers. Due to COVID-19 recommendations, pt is not currently in our office.    I discussed the limitations, risks, security and privacy concerns of performing an evaluation and management service by telephone and the availability of in person appointments. I also discussed with the patient that there may be a patient responsible charge related to this service. The patient expressed understanding and agreed to proceed.  Chief Complaint:   Routine Prenatal Visit (mild cramping)  History of Present Illness:   Kendra Rose is a 19 y.o. G1P0 female at [redacted]w[redacted]d with an Estimated Date of Delivery: 09/06/19 being evaluated today for ongoing management of a low-risk pregnancy.  Today she reports occasional contractions. Contractions: Irregular. Vag. Bleeding: None.  Movement: Present. denies leaking of fluid. Review of Systems:   Pertinent items are noted in HPI Denies abnormal vaginal discharge w/ itching/odor/irritation, headaches, visual changes, shortness of breath, chest pain, abdominal pain, severe nausea/vomiting, or problems with urination or bowel movements unless otherwise stated above. Pertinent History Reviewed:  Reviewed past medical,surgical, social, obstetrical and family history.  Reviewed problem list, medications and allergies. Physical Assessment:   Vitals:   07/29/19 1351  BP: (!) 142/67  Pulse: (!) 107  Height: 5\' 4"  (1.626 m)  Body mass index is 35.45 kg/m.        Physical Examination:   General:  Alert, oriented and cooperative.   Mental Status: Normal mood and affect perceived. Normal judgment and thought content.  Rest of physical exam deferred due to type of encounter  No results found for this or any previous visit  (from the past 24 hour(s)).  Assessment & Plan:  1) Pregnancy G1P0 at [redacted]w[redacted]d with an Estimated Date of Delivery: 09/06/19   2) Moving to 09/08/19 on March 5th, msg sent to [facility] office to schedule her for mid-March (around 37-38wks)   Meds: No orders of the defined types were placed in this encounter.   Labs/procedures today: none  Plan:  Continue routine obstetrical care.  Has home bp cuff.  Check bp weekly, let 08-15-1994 know if >140/90.  Next visit: prefers will be in person for GBS/GC/chlam    Reviewed: Preterm labor symptoms and general obstetric precautions including but not limited to vaginal bleeding, contractions, leaking of fluid and fetal movement were reviewed in detail with the patient. The patient was advised to call back or seek an in-person office evaluation/go to [facility] at [facility] for any urgent or concerning symptoms. All questions were answered. Please refer to After Visit Summary for other counseling recommendations.    I provided 10 minutes of non-face-to-face time during this encounter.  Follow-up: Return in about 2 weeks (around 08/12/2019) for LROB, in person.  No orders of the defined types were placed in this encounter.  10/12/2019 CNM 07/29/2019 2:07 PM

## 2019-08-04 ENCOUNTER — Telehealth: Payer: Self-pay | Admitting: Radiology

## 2019-08-04 NOTE — Telephone Encounter (Signed)
Per Cam Hai, patient is moving and needs to transfer care to [facility], called and left message for patient to call to schedule appointment from 08/19/19-08/26/19 .

## 2019-08-12 ENCOUNTER — Other Ambulatory Visit (HOSPITAL_COMMUNITY)
Admission: RE | Admit: 2019-08-12 | Discharge: 2019-08-12 | Disposition: A | Discharge status: Home / Self Care | Payer: Medicaid Other | Source: Ambulatory Visit | Attending: Obstetrics & Gynecology | Admitting: Obstetrics & Gynecology

## 2019-08-12 ENCOUNTER — Other Ambulatory Visit: Payer: Self-pay

## 2019-08-12 ENCOUNTER — Ambulatory Visit (INDEPENDENT_AMBULATORY_CARE_PROVIDER_SITE_OTHER): Payer: Medicaid Other | Admitting: Women's Health

## 2019-08-12 ENCOUNTER — Encounter: Payer: Self-pay | Admitting: Women's Health

## 2019-08-12 VITALS — BP 129/77 | HR 94 | Wt 217.4 lb

## 2019-08-12 DIAGNOSIS — Z113 Encounter for screening for infections with a predominantly sexual mode of transmission: Secondary | ICD-10-CM

## 2019-08-12 DIAGNOSIS — Z3403 Encounter for supervision of normal first pregnancy, third trimester: Secondary | ICD-10-CM | POA: Diagnosis not present

## 2019-08-12 DIAGNOSIS — Z1389 Encounter for screening for other disorder: Secondary | ICD-10-CM

## 2019-08-12 DIAGNOSIS — Z3A36 36 weeks gestation of pregnancy: Secondary | ICD-10-CM

## 2019-08-12 DIAGNOSIS — Z331 Pregnant state, incidental: Secondary | ICD-10-CM

## 2019-08-12 LAB — POCT URINALYSIS DIPSTICK OB
Blood, UA: NEGATIVE
Glucose, UA: NEGATIVE
Ketones, UA: NEGATIVE
Leukocytes, UA: NEGATIVE
Nitrite, UA: NEGATIVE

## 2019-08-12 NOTE — Patient Instructions (Addendum)
Kendra Rose, I greatly value your feedback.  If you receive a survey following your visit with [place] today, we appreciate you taking the time to fill it out.  Thanks, Kendra Rose, CNM, [facility]  [facility] HAS MOVED!!! It is now [facility] ([address] [place], [place] [address]) Entrance located off of [address] Free 24/7 valet parking   Go to [email or web address] to register for FREE online childbirth classes    Call the office [phone]) or go to [facility] if:  You begin to have strong, frequent contractions  Your water breaks.  Sometimes it is a big gush of fluid, sometimes it is just a trickle that keeps getting your panties wet or running down your legs  You have vaginal bleeding.  It is normal to have a small amount of spotting if your cervix was checked.   You don't feel your baby moving like normal.  If you don't, get you something to eat and drink and lay down and focus on feeling your baby move.  You should feel at least 10 movements in 2 hours.  If you don't, you should call the office or go to [facility].   Home Blood Pressure Monitoring for Patients   Your provider has recommended that you check your blood pressure (BP) at least once a week at home. If you do not have a blood pressure cuff at home, one will be provided for you. Contact your provider if you have not received your monitor within 1 week.   Helpful Tips for Accurate Home Blood Pressure Checks  . Don't smoke, exercise, or drink caffeine 30 minutes before checking your BP . Use the restroom before checking your BP (a full bladder can raise your pressure) . Relax in a comfortable upright chair . Feet on the ground . Left arm resting comfortably on a flat surface at the level of your heart . Legs uncrossed . Back supported . Sit quietly and don't talk . Place the cuff on your bare arm . Adjust snuggly, so that only two fingertips can fit between your skin and  the top of the cuff . Check 2 readings separated by at least one minute . Keep a log of your BP readings . For a visual, please reference this diagram: http://ccnc.care/bpdiagram  Provider Name: [facility]     Phone: [phone]  Zone 1: ALL CLEAR  Continue to monitor your symptoms:  . BP reading is less than 140 (top number) or less than 90 (bottom number)  . No right upper stomach pain . No headaches or seeing spots . No feeling nauseated or throwing up . No swelling in face and hands  Zone 2: CAUTION Call your doctor's office for any of the following:  . BP reading is greater than 140 (top number) or greater than 90 (bottom number)  . Stomach pain under your ribs in the middle or right side . Headaches or seeing spots . Feeling nauseated or throwing up . Swelling in face and hands  Zone 3: EMERGENCY  Seek immediate medical care if you have any of the following:  . BP reading is greater than160 (top number) or greater than 110 (bottom number) . Severe headaches not improving with Tylenol . Serious difficulty catching your breath . Any worsening symptoms from Zone 2    Braxton Hicks Contractions Contractions of the uterus can occur throughout pregnancy, but they are not always a sign that you are in  labor. You may have practice contractions called Braxton Hicks contractions. These false labor contractions are sometimes confused with true labor. What are [place] contractions? Braxton Hicks contractions are tightening movements that occur in the muscles of the uterus before labor. Unlike true labor contractions, these contractions do not result in opening (dilation) and thinning of the cervix. Toward the end of pregnancy (32-34 weeks), Braxton Hicks contractions can happen more often and may become stronger. These contractions are sometimes difficult to tell apart from true labor because they can be very uncomfortable. You should not feel embarrassed if you go to the  hospital with false labor. Sometimes, the only way to tell if you are in true labor is for your health care provider to look for changes in the cervix. The health care provider will do a physical exam and may monitor your contractions. If you are not in true labor, the exam should show that your cervix is not dilating and your water has not broken. If there are no other health problems associated with your pregnancy, it is completely safe for you to be sent home with false labor. You may continue to have Braxton Hicks contractions until you go into true labor. How to tell the difference between true labor and false labor True labor  Contractions last 30-70 seconds.  Contractions become very regular.  Discomfort is usually felt in the top of the uterus, and it spreads to the lower abdomen and low back.  Contractions do not go away with walking.  Contractions usually become more intense and increase in frequency.  The cervix dilates and gets thinner. False labor  Contractions are usually shorter and not as strong as true labor contractions.  Contractions are usually irregular.  Contractions are often felt in the front of the lower abdomen and in the groin.  Contractions may go away when you walk around or change positions while lying down.  Contractions get weaker and are shorter-lasting as time goes on.  The cervix usually does not dilate or become thin. Follow these instructions at home:   Take over-the-counter and prescription medicines only as told by your health care provider.  Keep up with your usual exercises and follow other instructions from your health care provider.  Eat and drink lightly if you think you are going into labor.  If Braxton Hicks contractions are making you uncomfortable: ? Change your position from lying down or resting to walking, or change from walking to resting. ? Sit and rest in a tub of warm water. ? Drink enough fluid to keep your urine pale  yellow. Dehydration may cause these contractions. ? Do slow and deep breathing several times an hour.  Keep all follow-up prenatal visits as told by your health care provider. This is important. Contact a health care provider if:  You have a fever.  You have continuous pain in your abdomen. Get help right away if:  Your contractions become stronger, more regular, and closer together.  You have fluid leaking or gushing from your vagina.  You pass blood-tinged mucus (bloody show).  You have bleeding from your vagina.  You have low back pain that you never had before.  You feel your baby's head pushing down and causing pelvic pressure.  Your baby is not moving inside you as much as it used to. Summary  Contractions that occur before labor are called Braxton Hicks contractions, false labor, or practice contractions.  Braxton Hicks contractions are usually shorter, weaker, farther apart, and less  regular than true labor contractions. True labor contractions usually become progressively stronger and regular, and they become more frequent.  Manage discomfort from [facility] contractions by changing position, resting in a warm bath, drinking plenty of water, or practicing deep breathing. This information is not intended to replace advice given to you by your health care provider. Make sure you discuss any questions you have with your health care provider. Document Revised: 05/11/2017 Document Reviewed: 10/12/2016 Elsevier Patient Education  [place] [facility]  [facility] [address] [place], [place] [address] Phone - [phone]   Fax - [place] [address] [place], [place]  [address] Phone - [phone]   Fax - [phone]  [place]. [address] [place] [address] [place], [place]  [address] Phone - [phone]   Fax - [phone]  [facility] [address] [place], [place], [place]  [address] Phone - [phone]   Fax - [phone]  [place] [address], [place], [place]  [address] Phone - [phone]   Fax - [place] [address] [place], [place]  [address] Phone - [phone]   Fax - [place] [facility] [place] [address], [place], [place]  [address] Phone - [phone]   Fax - [phone]  [place] [address] [place], [place], [place]  [address] Phone - [phone]   Fax - [place] [address] [place], [place]  [address] Phone - [phone]   Fax - [phone] [facility] [address]. [address] [place], [place]  [address] Phone - [phone]   Fax - [phone]  [place] [address] [place], [place]  [address] Phone - [phone]   Fax - [phone]  [facility] AT [place], [place], [place]  [address] Phone - [phone]   Fax - [place] [address], [place], [place]  [address] Phone - [phone]   Fax - [phone]  [facility] [address], [place], [place]  [address] Phone - [place], [place]  [address] Phone - [phone]   Fax - [place] [address], [place], [place]  [address] Phone - [phone]   Fax - [phone]  [place] [address] [place], [place]  [address] Phone - [phone]   Fax - [place]. [place], [place]  [address] Phone - [phone]   Fax - [place], [place], [place]  [address] Phone - [phone]   Fax  - [place] [address], [place], [place]  [address] Phone - [phone]   Fax - [phone]  [place] [address] [place], [place]  [address] Phone - [phone]   Fax - [place] [address], [place], [place]  [address] Phone - [phone]   Fax - [phone]  [place] [address] [place],   [address] Phone - [phone]   Fax - [phone] [place] [address] [place],   [address] Phone - [phone]  Fax - [phone]  [facility] [address] [place], [place]  [address] Phone - [phone]   Fax - [phone]  [facility] - [place] [address] [place], [place]  [address] Phone - [phone]   Fax - [phone]

## 2019-08-12 NOTE — Progress Notes (Signed)
   LOW-RISK PREGNANCY VISIT Patient name: Kendra Rose MRN [id number]  Date of birth: [birth year] Chief Complaint:   Routine Prenatal Visit  History of Present Illness:   Kendra Rose is a 19 y.o. G1P0 female at [redacted]w[redacted]d with an Estimated Date of Delivery: 09/06/19 being seen today for ongoing management of a low-risk pregnancy.  Today she reports bumps on vulva. Contractions: Irregular. Vag. Bleeding: None.  Movement: Present. denies leaking of fluid. Review of Systems:   Pertinent items are noted in HPI Denies abnormal vaginal discharge w/ itching/odor/irritation, headaches, visual changes, shortness of breath, chest pain, abdominal pain, severe nausea/vomiting, or problems with urination or bowel movements unless otherwise stated above. Pertinent History Reviewed:  Reviewed past medical,surgical, social, obstetrical and family history.  Reviewed problem list, medications and allergies. Physical Assessment:   Vitals:   08/12/19 1120  BP: 129/77  Pulse: 94  Weight: 217 lb 6.4 oz (98.6 kg)  Body mass index is 37.32 kg/m.        Physical Examination:   General appearance: Well appearing, and in no distress  Mental status: Alert, oriented to person, place, and time  Skin: Warm & dry  Cardiovascular: Normal heart rate noted  Respiratory: Normal respiratory effort, no distress  Abdomen: Soft, gravid, nontender  Pelvic: Cervical exam performed , multiple varicose vein tips Rt labia majora Dilation: Fingertip Effacement (%): Thick Station: -3  Extremities: Edema: Trace  Fetal Status: Fetal Heart Rate (bpm): 137 Fundal Height: 36 cm Movement: Present Presentation: Vertex  Chaperone: Stoney Bang    Results for orders placed or performed in visit on 08/12/19 (from the past 24 hour(s))  POC Urinalysis Dipstick OB   Collection Time: 08/12/19 11:21 AM  Result Value Ref Range   Color, UA     Clarity, UA     Glucose, UA Negative Negative   Bilirubin, UA     Ketones, UA n    Spec Grav,  UA     Blood, UA n    pH, UA     POC,PROTEIN,UA Trace Negative, Trace, Small (1+), Moderate (2+), Large (3+), 4+   Urobilinogen, UA     Nitrite, UA n    Leukocytes, UA Negative Negative   Appearance     Odor      Assessment & Plan:  1) Low-risk pregnancy G1P0 at [redacted]w[redacted]d with an Estimated Date of Delivery: 09/06/19    Meds: No orders of the defined types were placed in this encounter.  Labs/procedures today: gbs, gc/ct, sve  Plan:  Continue routine obstetrical care  Next visit: prefers online    Reviewed: Preterm labor symptoms and general obstetric precautions including but not limited to vaginal bleeding, contractions, leaking of fluid and fetal movement were reviewed in detail with the patient.  All questions were answered. Has home bp cuff. Check bp weekly, let us know if >140/90.   Follow-up: Return in about 1 week (around 08/19/2019) for LROB, MyChart Video, CNM.  Orders Placed This Encounter  Procedures  . Strep Gp B NAA  . POC Urinalysis Dipstick OB   Cheral Marker CNM, [facility] 08/12/2019 11:55 AM

## 2019-08-13 LAB — CERVICOVAGINAL ANCILLARY ONLY
Chlamydia: NEGATIVE
Comment: NEGATIVE
Comment: NORMAL
Neisseria Gonorrhea: NEGATIVE

## 2019-08-14 LAB — STREP GP B NAA: Strep Gp B NAA: NEGATIVE

## 2019-08-19 ENCOUNTER — Encounter: Payer: Self-pay | Admitting: *Deleted

## 2019-08-19 ENCOUNTER — Other Ambulatory Visit: Payer: Self-pay

## 2019-08-19 ENCOUNTER — Inpatient Hospital Stay (HOSPITAL_COMMUNITY)
Admission: AD | Admit: 2019-08-19 | Discharge: 2019-08-23 | DRG: 807 | Disposition: A | Discharge status: Home / Self Care | Payer: Medicaid Other | Attending: Family Medicine | Admitting: Family Medicine

## 2019-08-19 ENCOUNTER — Encounter (HOSPITAL_COMMUNITY): Payer: Self-pay | Admitting: Obstetrics and Gynecology

## 2019-08-19 ENCOUNTER — Encounter: Payer: Self-pay | Admitting: Women's Health

## 2019-08-19 ENCOUNTER — Telehealth (INDEPENDENT_AMBULATORY_CARE_PROVIDER_SITE_OTHER): Payer: Medicaid Other | Admitting: Women's Health

## 2019-08-19 VITALS — BP 154/92 | HR 98

## 2019-08-19 DIAGNOSIS — D649 Anemia, unspecified: Secondary | ICD-10-CM | POA: Diagnosis present

## 2019-08-19 DIAGNOSIS — Z87891 Personal history of nicotine dependence: Secondary | ICD-10-CM

## 2019-08-19 DIAGNOSIS — Z3A00 Weeks of gestation of pregnancy not specified: Secondary | ICD-10-CM | POA: Diagnosis not present

## 2019-08-19 DIAGNOSIS — O1413 Severe pre-eclampsia, third trimester: Secondary | ICD-10-CM | POA: Diagnosis not present

## 2019-08-19 DIAGNOSIS — Z20822 Contact with and (suspected) exposure to covid-19: Secondary | ICD-10-CM | POA: Diagnosis present

## 2019-08-19 DIAGNOSIS — O1002 Pre-existing essential hypertension complicating childbirth: Secondary | ICD-10-CM | POA: Diagnosis not present

## 2019-08-19 DIAGNOSIS — O9902 Anemia complicating childbirth: Secondary | ICD-10-CM | POA: Diagnosis present

## 2019-08-19 DIAGNOSIS — Z3A37 37 weeks gestation of pregnancy: Secondary | ICD-10-CM

## 2019-08-19 DIAGNOSIS — O1414 Severe pre-eclampsia complicating childbirth: Principal | ICD-10-CM | POA: Diagnosis present

## 2019-08-19 DIAGNOSIS — Z3403 Encounter for supervision of normal first pregnancy, third trimester: Secondary | ICD-10-CM

## 2019-08-19 DIAGNOSIS — R030 Elevated blood-pressure reading, without diagnosis of hypertension: Secondary | ICD-10-CM

## 2019-08-19 DIAGNOSIS — O99333 Smoking (tobacco) complicating pregnancy, third trimester: Secondary | ICD-10-CM | POA: Diagnosis not present

## 2019-08-19 HISTORY — DX: Dermatitis, unspecified: L30.9

## 2019-08-19 LAB — COMPREHENSIVE METABOLIC PANEL
ALT: 11 U/L (ref 0–44)
AST: 20 U/L (ref 15–41)
Albumin: 2.8 g/dL — ABNORMAL LOW (ref 3.5–5.0)
Alkaline Phosphatase: 102 U/L (ref 38–126)
Anion gap: 11 (ref 5–15)
BUN: 7 mg/dL (ref 6–20)
CO2: 21 mmol/L — ABNORMAL LOW (ref 22–32)
Calcium: 8.9 mg/dL (ref 8.9–10.3)
Chloride: 103 mmol/L (ref 98–111)
Creatinine, Ser: 0.44 mg/dL (ref 0.44–1.00)
GFR calc Af Amer: >60 mL/min (ref >60)
GFR calc non Af Amer: >60 mL/min (ref >60)
Glucose, Bld: 129 mg/dL — ABNORMAL HIGH (ref 70–99)
Potassium: 3.4 mmol/L — ABNORMAL LOW (ref 3.5–5.1)
Sodium: 135 mmol/L (ref 135–145)
Total Bilirubin: 0.5 mg/dL (ref 0.3–1.2)
Total Protein: 6.7 g/dL (ref 6.5–8.1)

## 2019-08-19 LAB — CBC
HCT: 33.2 % — ABNORMAL LOW (ref 36.0–46.0)
Hemoglobin: 10.8 g/dL — ABNORMAL LOW (ref 12.0–15.0)
MCH: 26.6 pg (ref 26.0–34.0)
MCHC: 32.5 g/dL (ref 30.0–36.0)
MCV: 81.8 fL (ref 80.0–100.0)
Platelets: 155 10*3/uL (ref 150–400)
RBC: 4.06 MIL/uL (ref 3.87–5.11)
RDW: 14.8 % (ref 11.5–15.5)
WBC: 11.7 10*3/uL — ABNORMAL HIGH (ref 4.0–10.5)
nRBC: 0 % (ref 0.0–0.2)

## 2019-08-19 LAB — PROTEIN / CREATININE RATIO, URINE
Creatinine, Urine: 46 mg/dL
Protein Creatinine Ratio: 0.2 mg/mg{Cre} — ABNORMAL HIGH (ref 0.00–0.15)
Total Protein, Urine: 9 mg/dL

## 2019-08-19 LAB — SAMPLE TO BLOOD BANK

## 2019-08-19 LAB — TYPE AND SCREEN
ABO/RH(D): O POS
Antibody Screen: NEGATIVE

## 2019-08-19 MED ORDER — OXYTOCIN BOLUS FROM INFUSION
500.0000 mL | Freq: Once | INTRAVENOUS | Status: AC
  Administered 2019-08-21: 02:00:00 1000 mL via INTRAVENOUS

## 2019-08-19 MED ORDER — MAGNESIUM SULFATE 40 GM/1000ML IV SOLN
2.0000 g/h | INTRAVENOUS | Status: DC
  Administered 2019-08-20: 17:00:00 2 g/h via INTRAVENOUS
  Filled 2019-08-19 (×2): qty 1000

## 2019-08-19 MED ORDER — OXYTOCIN 40 UNITS IN NORMAL SALINE INFUSION - SIMPLE MED
2.5000 [IU]/h | INTRAVENOUS | Status: DC
  Filled 2019-08-19 (×2): qty 1000

## 2019-08-19 MED ORDER — LACTATED RINGERS IV SOLN
500.0000 mL | INTRAVENOUS | Status: DC | PRN
  Administered 2019-08-19 – 2019-08-21 (×3): 1000 mL via INTRAVENOUS

## 2019-08-19 MED ORDER — PHENYLEPHRINE 40 MCG/ML (10ML) SYRINGE FOR IV PUSH (FOR BLOOD PRESSURE SUPPORT)
80.0000 ug | PREFILLED_SYRINGE | INTRAVENOUS | Status: DC | PRN
  Administered 2019-08-21: 02:00:00 80 ug via INTRAVENOUS

## 2019-08-19 MED ORDER — OXYCODONE-ACETAMINOPHEN 5-325 MG PO TABS: 1.0000 | ORAL_TABLET | ORAL | Status: DC | PRN

## 2019-08-19 MED ORDER — LACTATED RINGERS IV SOLN
500.0000 mL | Freq: Once | INTRAVENOUS | Status: AC
  Administered 2019-08-20: 08:00:00 500 mL via INTRAVENOUS

## 2019-08-19 MED ORDER — ACETAMINOPHEN 325 MG PO TABS
650.0000 mg | ORAL_TABLET | ORAL | Status: DC | PRN
  Administered 2019-08-20: 15:00:00 650 mg via ORAL
  Filled 2019-08-19: qty 2

## 2019-08-19 MED ORDER — DIPHENHYDRAMINE HCL 50 MG/ML IJ SOLN
12.5000 mg | INTRAMUSCULAR | Status: DC | PRN
  Administered 2019-08-20: 13:00:00 12.5 mg via INTRAVENOUS
  Filled 2019-08-19: qty 1

## 2019-08-19 MED ORDER — PHENYLEPHRINE 40 MCG/ML (10ML) SYRINGE FOR IV PUSH (FOR BLOOD PRESSURE SUPPORT)
80.0000 ug | PREFILLED_SYRINGE | INTRAVENOUS | Status: DC | PRN
  Filled 2019-08-19: qty 10

## 2019-08-19 MED ORDER — SOD CITRATE-CITRIC ACID 500-334 MG/5ML PO SOLN: 30.0000 mL | ORAL | Status: DC | PRN

## 2019-08-19 MED ORDER — EPHEDRINE 5 MG/ML INJ: 10.0000 mg | INTRAVENOUS | Status: DC | PRN

## 2019-08-19 MED ORDER — FENTANYL-BUPIVACAINE-NACL 0.5-0.125-0.9 MG/250ML-% EP SOLN
12.0000 mL/h | EPIDURAL | Status: DC | PRN
  Filled 2019-08-19 (×2): qty 250

## 2019-08-19 MED ORDER — ONDANSETRON HCL 4 MG/2ML IJ SOLN: 4.0000 mg | Freq: Four times a day (QID) | INTRAMUSCULAR | Status: DC | PRN

## 2019-08-19 MED ORDER — LACTATED RINGERS IV SOLN: INTRAVENOUS | Status: DC

## 2019-08-19 MED ORDER — OXYCODONE-ACETAMINOPHEN 5-325 MG PO TABS: 2.0000 | ORAL_TABLET | ORAL | Status: DC | PRN

## 2019-08-19 MED ORDER — LIDOCAINE HCL (PF) 1 % IJ SOLN
30.0000 mL | INTRAMUSCULAR | Status: AC | PRN
  Administered 2019-08-20: 09:00:00 7 mL via SUBCUTANEOUS
  Administered 2019-08-20: 5 mL via SUBCUTANEOUS

## 2019-08-19 MED ORDER — MAGNESIUM SULFATE BOLUS VIA INFUSION
4.0000 g | Freq: Once | INTRAVENOUS | Status: AC
  Administered 2019-08-19: 23:00:00 4 g via INTRAVENOUS
  Filled 2019-08-19: qty 1000

## 2019-08-19 NOTE — MAU Note (Signed)
.   Kendra Rose is a 19 y.o. at [redacted]w[redacted]d here in MAU reporting: elevated blood pressure 150s/90s today at 1400. [facility] and [facility] to be evaluated. She is reporting a headache and floaters in her vision that started a few days ago. No VB or LOF. Patient endorses good fetal movement.   Pain score: 7 Vitals:   08/19/19 2138  BP: (!) 148/82  Pulse: (!) 102  Resp: 15  Temp: 98.1 F (36.7 C)  SpO2: 100%     FHT: 157 Lab orders placed from triage:

## 2019-08-19 NOTE — Progress Notes (Signed)
   TELEHEALTH VIRTUAL OBSTETRICS VISIT ENCOUNTER NOTE Patient name: Kendra Rose MRN [id number]  Date of birth: [birth year]  I connected with patient on 08/19/19 at  3:10 PM EST by MyChart video  and verified that I am speaking with the correct person using two identifiers. Due to COVID-19 recommendations, pt is not currently in our office.    I discussed the limitations, risks, security and privacy concerns of performing an evaluation and management service by telephone and the availability of in person appointments. I also discussed with the patient that there may be a patient responsible charge related to this service. The patient expressed understanding and agreed to proceed.  Chief Complaint:   Routine Prenatal Visit  History of Present Illness:   Kendra Rose is a 19 y.o. G1P0 female at [redacted]w[redacted]d with an Estimated Date of Delivery: 09/06/19 being evaluated today for ongoing management of a low-risk pregnancy.  Today she reports no complaints. BP elevated today, was normal last week in office, was elevated at 34wk virtual visit. Denies ha, visual changes, ruq/epigastric pain, n/v.   Contractions: Not present. Vag. Bleeding: None.  Movement: Present. denies leaking of fluid. Review of Systems:   Pertinent items are noted in HPI Denies abnormal vaginal discharge w/ itching/odor/irritation, headaches, visual changes, shortness of breath, chest pain, abdominal pain, severe nausea/vomiting, or problems with urination or bowel movements unless otherwise stated above. Pertinent History Reviewed:  Reviewed past medical,surgical, social, obstetrical and family history.  Reviewed problem list, medications and allergies. Physical Assessment:   Vitals:   08/19/19 1510 08/19/19 1514  BP: 140/82 (!) 154/92  Pulse: 95 98  There is no height or weight on file to calculate BMI.        Physical Examination:   General:  Alert, oriented and cooperative.   Mental Status: Normal mood and affect perceived.  Normal judgment and thought content.  Rest of physical exam deferred due to type of encounter  No results found for this or any previous visit (from the past 24 hour(s)).  Assessment & Plan:  1) Pregnancy G1P0 at [redacted]w[redacted]d with an Estimated Date of Delivery: 09/06/19   2) Elevated bp, today and at 34wks (both were home readings), last week in office bp was normal, asymptomatic. Could be her bp cuff at home. To go to [facility] since she lives in [place] and it is 4:15 for eval. Steward Drone, CNM notified   Meds: No orders of the defined types were placed in this encounter.   Labs/procedures today: none  Plan:  Continue routine obstetrical care.  Next visit: prefers in person, if d/c'd  Reviewed: Term labor symptoms and general obstetric precautions including but not limited to vaginal bleeding, contractions, leaking of fluid and fetal movement were reviewed in detail with the patient. The patient was advised to call back or seek an in-person office evaluation/go to MAU at [facility] for any urgent or concerning symptoms. All questions were answered. Please refer to After Visit Summary for other counseling recommendations.    I provided 15 minutes of non-face-to-face time during this encounter.  Follow-up: Return in about 1 week (around 08/26/2019) for LROB, in person, CNM.  No orders of the defined types were placed in this encounter.  Cheral Marker CNM, [facility] 08/19/2019 4:13 PM

## 2019-08-19 NOTE — MAU Provider Note (Signed)
Chief Complaint:  Hypertension and Headache   First Provider Initiated Contact with Patient 08/19/19 2217     HPI: Kendra Rose is a 19 y.o. G1P0 at 88w3dwho presents to maternity admissions reporting elevated BP at home, headache for 2 days and seeing floaters. . She reports good fetal movement, denies LOF, vaginal bleeding, vaginal itching/burning, urinary symptoms, h/a, dizziness, n/v, diarrhea, constipation or fever/chills.  She denies RUQ abdominal pain.  Hypertension This is a recurrent problem. The current episode started in the past 7 days. The problem is unchanged. Associated symptoms include headaches and peripheral edema. Pertinent negatives include no blurred vision (but sees floaters) or shortness of breath. There are no associated agents to hypertension. There are no known risk factors for coronary artery disease. Past treatments include nothing. There are no compliance problems.   Headache  This is a new problem. The current episode started yesterday. The problem occurs constantly. The problem has been unchanged. The quality of the pain is described as aching. Associated symptoms include a visual change. Pertinent negatives include no abdominal pain, blurred vision (but sees floaters), dizziness, fever or photophobia. Nothing aggravates the symptoms. She has tried nothing for the symptoms. Her past medical history is significant for hypertension.   RN Note: Kendra Rose is a 19 y.o. at [redacted]w[redacted]d here in MAU reporting: elevated blood pressure 150s/90s today at 1400. She had a video visit with [facility] today and they sent her to be evaluated. She is reporting a headache and floaters in her vision that started a few days ago. No VB or LOF. Patient endorses good fetal movement.   Past Medical History: Past Medical History:  Diagnosis Date  . Eczema     Past obstetric history: OB History  Gravida Para Term Preterm AB Living  1            SAB TAB Ectopic Multiple Live Births                # Outcome Date GA Lbr Len/2nd Weight Sex Delivery Anes PTL Lv  1 Current             Past Surgical History: History reviewed. No pertinent surgical history.  Family History: Family History  Problem Relation Age of Onset  . Diabetes Father     Social History: Social History   Tobacco Use  . Smoking status: Never Smoker  . Smokeless tobacco: Never Used  Substance Use Topics  . Alcohol use: Not Currently  . Drug use: Never    Allergies: No Known Allergies  Meds:  Medications Prior to Admission  Medication Sig Dispense Refill Last Dose  . Acetaminophen (TYLENOL PO) Take by mouth as needed.   08/18/2019 at Unknown time  . diphenhydrAMINE-APAP, sleep, (TYLENOL PM EXTRA STRENGTH PO) Take by mouth at bedtime as needed.   08/18/2019 at Unknown time  . Prenatal Vit-Fe Fumarate-FA (PRENATAL VITAMIN PO) Take by mouth.   08/19/2019 at Unknown time  . Blood Pressure Monitor MISC For regular home bp monitoring during pregnancy 1 each 0     I have reviewed patient's Past Medical Hx, Surgical Hx, Family Hx, Social Hx, medications and allergies.   ROS:  Review of Systems  Constitutional: Negative for fever.  Eyes: Negative for blurred vision (but sees floaters) and photophobia.  Respiratory: Negative for shortness of breath.   Gastrointestinal: Negative for abdominal pain.  Neurological: Positive for headaches. Negative for dizziness.   Other systems negative  Physical Exam   Patient Vitals for the  past 24 hrs:  BP Temp Temp src Pulse Resp SpO2 Height Weight  08/19/19 2200 (!) 145/89 -- -- 97 -- 98 % -- --  08/19/19 2153 (!) 145/80 -- -- 94 -- -- -- --  08/19/19 2138 (!) 148/82 98.1 F (36.7 C) Oral (!) 102 15 100 % 5\' 4"  (1.626 m) --  08/19/19 2137 -- -- -- -- -- -- -- 101.7 kg   Constitutional: Well-developed, well-nourished female in no acute distress.  Cardiovascular: normal rate and rhythm Respiratory: normal effort, clear to auscultation bilaterally GI: Abd soft,  non-tender, gravid appropriate for gestational age.   No rebound or guarding. MS: Extremities nontender, no edema, normal ROM Neurologic: Alert and oriented x 4.  GU: Neg CVAT.  PELVIC EXAM: Dilation: 1 Effacement (%): 80 Cervical Position: Posterior Station: -2, -3 Presentation: Vertex Exam by:: [email or web address], CNM   FHT:  Baseline 140 , moderate variability, accelerations present, no decelerations Contractions:  Irregular     Labs: O/Positive/-- (08/27 0000) Results for orders placed or performed during the hospital encounter of 08/19/19 (from the past 24 hour(s))  CBC     Status: Abnormal   Collection Time: 08/19/19  9:46 PM  Result Value Ref Range   WBC 11.7 (H) 4.0 - 10.5 K/uL   RBC 4.06 3.87 - 5.11 MIL/uL   Hemoglobin 10.8 (L) 12.0 - 15.0 g/dL   HCT 10/19/19 (L) 50.0 - 93.8 %   MCV 81.8 80.0 - 100.0 fL   MCH 26.6 26.0 - 34.0 pg   MCHC 32.5 30.0 - 36.0 g/dL   RDW 18.2 99.3 - 71.6 %   Platelets 155 150 - 400 K/uL   nRBC 0.0 0.0 - 0.2 %  Comprehensive metabolic panel     Status: Abnormal   Collection Time: 08/19/19  9:46 PM  Result Value Ref Range   Sodium 135 135 - 145 mmol/L   Potassium 3.4 (L) 3.5 - 5.1 mmol/L   Chloride 103 98 - 111 mmol/L   CO2 21 (L) 22 - 32 mmol/L   Glucose, Bld 129 (H) 70 - 99 mg/dL   BUN 7 6 - 20 mg/dL   Creatinine, Ser 10/19/19 0.44 - 1.00 mg/dL   Calcium 8.9 8.9 - 8.93 mg/dL   Total Protein 6.7 6.5 - 8.1 g/dL   Albumin 2.8 (L) 3.5 - 5.0 g/dL   AST 20 15 - 41 U/L   ALT 11 0 - 44 U/L   Alkaline Phosphatase 102 38 - 126 U/L   Total Bilirubin 0.5 0.3 - 1.2 mg/dL   GFR calc non Af Amer >60 >60 mL/min   GFR calc Af Amer >60 >60 mL/min   Anion gap 11 5 - 15  Sample to Blood Bank     Status: None   Collection Time: 08/19/19  9:46 PM  Result Value Ref Range   Blood Bank Specimen SAMPLE AVAILABLE FOR TESTING    Sample Expiration      08/20/2019,2359 Performed at [facility], [address], [place], [place]   Type and screen [facility]     Status: None   Collection Time: 08/19/19  9:46 PM  Result Value Ref Range   ABO/RH(D) O POS    Antibody Screen NEG    Sample Expiration      08/22/2019,2359 Performed at [facility], [address], [place], [place]   ABO/Rh     Status: None (Preliminary result)   Collection Time: 08/19/19  9:46 PM  Result Value Ref Range   ABO/RH(D)      O POS Performed at [facility], [address], [place], [place] [address]   Protein / creatinine ratio, urine     Status: Abnormal   Collection Time: 08/19/19  9:55 PM  Result Value Ref Range   Creatinine, Urine 46.00 mg/dL   Total Protein, Urine 9 mg/dL   Protein Creatinine Ratio 0.20 (H) 0.00 - 0.15 mg/mg[Cre]    Imaging:  No results found.  MAU Course/MDM: I have ordered labs and reviewed results.  NST reviewed, reactive Discussed diagnosis with patient Recommend induction of labor Recommend Magnesium Sulfate due to headache and visual changes  Assessment: Single IUP at [redacted]w[redacted]d Preeclampsia with severe features  Plan: Admit to Labor and Delivery Routine orders  Labor team to follow   Wynelle Bourgeois CNM, MSN Certified Nurse-Midwife 08/19/2019 10:17 PM

## 2019-08-19 NOTE — H&P (Addendum)
OBSTETRIC ADMISSION HISTORY AND PHYSICAL  Kendra Rose is a 19 y.o. female G1P0 with IUP at [redacted]w[redacted]d by LMP presenting for IOL due to PreE with severe features. She reports +FMs, No LOF, no VB. She does report floaters in her vision, headaches, peripheral edema, and RUQ pain for the last few days.  She plans on bottle feeding. She request Depo for birth control. She received her prenatal care at [facility]   Dating: By LMP --->  Estimated Date of Delivery: 09/06/19  Sono: records not in chart   Prenatal History/Complications: Smoker gHTN  Past Medical History: Past Medical History:  Diagnosis Date  . Eczema     Past Surgical History: History reviewed. No pertinent surgical history.  Obstetrical History: OB History    Gravida  1   Para      Term      Preterm      AB      Living        SAB      TAB      Ectopic      Multiple      Live Births              Social History Social History   Socioeconomic History  . Marital status: Single    Spouse name: Not on file  . Number of children: Not on file  . Years of education: Not on file  . Highest education level: Not on file  Occupational History  . Not on file  Tobacco Use  . Smoking status: Never Smoker  . Smokeless tobacco: Never Used  Substance and Sexual Activity  . Alcohol use: Not Currently  . Drug use: Never  . Sexual activity: Yes    Birth control/protection: None  Other Topics Concern  . Not on file  Social History Narrative  . Not on file   Social Determinants of Health   Financial Resource Strain:   . Difficulty of Paying Living Expenses: Not on file  Food Insecurity:   . Worried About Charity fundraiser in the Last Year: Not on file  . Ran Out of Food in the Last Year: Not on file  Transportation Needs:   . Lack of Transportation (Medical): Not on file  . Lack of Transportation (Non-Medical): Not on file  Physical Activity:   . Days of Exercise per Week: Not on file  . Minutes  of Exercise per Session: Not on file  Stress:   . Feeling of Stress : Not on file  Social Connections:   . Frequency of Communication with Friends and Family: Not on file  . Frequency of Social Gatherings with Friends and Family: Not on file  . Attends Religious Services: Not on file  . Active Member of Clubs or Organizations: Not on file  . Attends Archivist Meetings: Not on file  . Marital Status: Not on file    Family History: Family History  Problem Relation Age of Onset  . Diabetes Father     Allergies: No Known Allergies  Medications Prior to Admission  Medication Sig Dispense Refill Last Dose  . Acetaminophen (TYLENOL PO) Take by mouth as needed.   08/18/2019 at Unknown time  . diphenhydrAMINE-APAP, sleep, (TYLENOL PM EXTRA STRENGTH PO) Take by mouth at bedtime as needed.   08/18/2019 at Unknown time  . Prenatal Vit-Fe Fumarate-FA (PRENATAL VITAMIN PO) Take by mouth.   08/19/2019 at Unknown time  . Blood Pressure Monitor MISC For regular home  bp monitoring during pregnancy 1 each 0      Review of Systems   All systems reviewed and negative except as stated in HPI  Blood pressure 126/84, pulse 98, temperature 98 F (36.7 C), temperature source Oral, resp. rate 17, height 5\' 4"  (1.626 m), weight 101.7 kg, last menstrual period 11/30/2018, SpO2 99 %. General appearance: alert, cooperative and no distress Lungs: clear to auscultation bilaterally Heart: regular rate and rhythm Abdomen: soft, non-tender; bowel sounds normal Extremities: Homans sign is negative, no sign of DVT DTR's +1 and absent clonus Presentation: cephalic Fetal monitoringBaseline: 130 bpm, Variability: Good {> 6 bpm), Accelerations: Reactive and Decelerations: Absent Uterine activityFrequency: Every 2-4 minutes Dilation: 1 Effacement (%): 80 Station: -2 Exam by:: [email or web address], CNM   Prenatal labs: ABO, Rh: O/Positive/-- (08/27 0000) Antibody: Negative (01/12 0844) Rubella: Immune  (08/27 0000) RPR: Non Reactive (01/12 0844)  HBsAg: Negative (08/27 0000)  HIV: Non Reactive (01/12 0844)  GBS: --11-19-2000 (03/02 1426)  2 hr GTT - 84/152/120 Genetic screening  NT/IT neg per pt Anatomy [birth year]1984 - records not in chart  Prenatal Transfer Tool  Maternal Diabetes: No Genetic Screening: Normal Maternal Ultrasounds/Referrals: Normal Fetal Ultrasounds or other Referrals:  None Maternal Substance Abuse:  Yes:  Type: Smoker Significant Maternal Medications:  None Significant Maternal Lab Results: Group B Strep negative  Results for orders placed or performed during the hospital encounter of 08/19/19 (from the past 24 hour(s))  CBC   Collection Time: 08/19/19  9:46 PM  Result Value Ref Range   WBC 11.7 (H) 4.0 - 10.5 K/uL   RBC 4.06 3.87 - 5.11 MIL/uL   Hemoglobin 10.8 (L) 12.0 - 15.0 g/dL   HCT 10/19/19 (L) 01.7 - 49.4 %   MCV 81.8 80.0 - 100.0 fL   MCH 26.6 26.0 - 34.0 pg   MCHC 32.5 30.0 - 36.0 g/dL   RDW 49.6 75.9 - 16.3 %   Platelets 155 150 - 400 K/uL   nRBC 0.0 0.0 - 0.2 %  Comprehensive metabolic panel   Collection Time: 08/19/19  9:46 PM  Result Value Ref Range   Sodium 135 135 - 145 mmol/L   Potassium 3.4 (L) 3.5 - 5.1 mmol/L   Chloride 103 98 - 111 mmol/L   CO2 21 (L) 22 - 32 mmol/L   Glucose, Bld 129 (H) 70 - 99 mg/dL   BUN 7 6 - 20 mg/dL   Creatinine, Ser 10/19/19 0.44 - 1.00 mg/dL   Calcium 8.9 8.9 - 6.59 mg/dL   Total Protein 6.7 6.5 - 8.1 g/dL   Albumin 2.8 (L) 3.5 - 5.0 g/dL   AST 20 15 - 41 U/L   ALT 11 0 - 44 U/L   Alkaline Phosphatase 102 38 - 126 U/L   Total Bilirubin 0.5 0.3 - 1.2 mg/dL   GFR calc non Af Amer >60 >60 mL/min   GFR calc Af Amer >60 >60 mL/min   Anion gap 11 5 - 15  Sample to Blood Bank   Collection Time: 08/19/19  9:46 PM  Result Value Ref Range   Blood Bank Specimen SAMPLE AVAILABLE FOR TESTING    Sample Expiration      08/20/2019,2359 Performed at [facility], [address], [place], [place]     Patient  Active Problem List   Diagnosis Date Noted  . Supervision of normal first pregnancy 06/23/2019    Assessment/Plan:  Kendra Rose is a 19 y.o. G1P0 at [redacted]w[redacted]d here for  IOL due to PreE with severe features  #Labor: Unfavorable cervix. Plan for foley bulb placement and vaginal Cytotec.  #PreE with severe features: Start magnesium. Most recent BP 135/81. No current need for antihypertensives. Normal LFT's and PLT >100. Normal protein/creatinine ratio #Pain: Wants an epidural #FWB: Category 1 #ID:  GBS neg #MOF: Bottle #MOC: Depo #Circ:  Yes (outpatient)  Dennison Bulla, MD  08/19/2019, 10:40 PM   I spoke with and examined patient and agree with resident/PA-S/MS/SNM's note and plan of care. Pt w/ multiple elevated bp's, none severe-range, labs normal, however has headache, seeing floaters earlier, ruq pain earlier, none now. So based on sx will call severe pre-e, mag started. Monitor bp's for severe range.  Cervical foley bulb inserted and inflated w/ 32ml LR w/o difficulty  Plan cytotec q4hr pv, pit when foley out Cheral Marker, CNM, WHNP-BC 08/20/2019 12:12 AM

## 2019-08-20 ENCOUNTER — Inpatient Hospital Stay (HOSPITAL_COMMUNITY): Payer: Medicaid Other | Admitting: Anesthesiology

## 2019-08-20 ENCOUNTER — Other Ambulatory Visit: Payer: Self-pay

## 2019-08-20 LAB — CBC
HCT: 33 % — ABNORMAL LOW (ref 36.0–46.0)
Hemoglobin: 10.7 g/dL — ABNORMAL LOW (ref 12.0–15.0)
MCH: 26.9 pg (ref 26.0–34.0)
MCHC: 32.4 g/dL (ref 30.0–36.0)
MCV: 82.9 fL (ref 80.0–100.0)
Platelets: 153 10*3/uL (ref 150–400)
RBC: 3.98 MIL/uL (ref 3.87–5.11)
RDW: 15 % (ref 11.5–15.5)
WBC: 12.3 10*3/uL — ABNORMAL HIGH (ref 4.0–10.5)
nRBC: 0 % (ref 0.0–0.2)

## 2019-08-20 LAB — SARS CORONAVIRUS 2 (TAT 6-24 HRS): SARS Coronavirus 2: NEGATIVE

## 2019-08-20 LAB — ABO/RH: ABO/RH(D): O POS

## 2019-08-20 MED ORDER — BUPIVACAINE HCL (PF) 0.25 % IJ SOLN
INTRAMUSCULAR | Status: DC | PRN
  Administered 2019-08-20: 8 mL via EPIDURAL

## 2019-08-20 MED ORDER — FENTANYL CITRATE (PF) 100 MCG/2ML IJ SOLN
100.0000 ug | INTRAMUSCULAR | Status: DC | PRN
  Administered 2019-08-20 (×3): 100 ug via INTRAVENOUS
  Filled 2019-08-20 (×3): qty 2

## 2019-08-20 MED ORDER — SODIUM CHLORIDE (PF) 0.9 % IJ SOLN
INTRAMUSCULAR | Status: DC | PRN
  Administered 2019-08-20: 12 mL/h via EPIDURAL

## 2019-08-20 MED ORDER — OXYTOCIN 40 UNITS IN NORMAL SALINE INFUSION - SIMPLE MED
1.0000 m[IU]/min | INTRAVENOUS | Status: DC
  Administered 2019-08-20: 10:00:00 2 m[IU]/min via INTRAVENOUS

## 2019-08-20 MED ORDER — MISOPROSTOL 25 MCG QUARTER TABLET
25.0000 ug | ORAL_TABLET | ORAL | Status: DC | PRN
  Administered 2019-08-20 (×2): 25 ug via VAGINAL
  Filled 2019-08-20 (×2): qty 1

## 2019-08-20 MED ORDER — FENTANYL CITRATE (PF) 100 MCG/2ML IJ SOLN
INTRAMUSCULAR | Status: AC
  Filled 2019-08-20: qty 2

## 2019-08-20 MED ORDER — FENTANYL CITRATE (PF) 100 MCG/2ML IJ SOLN
INTRAMUSCULAR | Status: DC | PRN
  Administered 2019-08-20: 100 ug via EPIDURAL

## 2019-08-20 MED ORDER — TERBUTALINE SULFATE 1 MG/ML IJ SOLN: 0.2500 mg | Freq: Once | INTRAMUSCULAR | Status: DC | PRN

## 2019-08-20 MED ORDER — BUTALBITAL-APAP-CAFFEINE 50-325-40 MG PO TABS
2.0000 | ORAL_TABLET | Freq: Four times a day (QID) | ORAL | Status: DC | PRN
  Administered 2019-08-20: 20:00:00 2 via ORAL
  Filled 2019-08-20: qty 2

## 2019-08-20 NOTE — Progress Notes (Signed)
Patient ID: Kendra Rose, female   DOB: November 12, 2000, 19 y.o.   MRN: [id number] Kendra Rose is a 19 y.o. G1P0 at [redacted]w[redacted]d admitted for induction of labor due to pre-e w/ severe fx.  Subjective: Doing well, uncomfortable w/ uc's, has had multiple doses of fentanyl, wants epidural. Denies current ha/visual changes/ruq pain  Objective: BP 131/86   Pulse 97   Temp 97.6 F (36.4 C) (Oral)   Resp 14   Ht 5\' 4"  (1.626 m)   Wt 102.1 kg   LMP 11/30/2018   SpO2 99%   BMI 38.62 kg/m  No intake/output data recorded.  FHT:  FHR: 120 bpm, variability: moderate,  accelerations:  Present,  decelerations:  Absent UC:   irregular  SVE:   Dilation: 5 Effacement (%): 80 Station: -2 Exam by:: [email or web address], RN  Labs: Lab Results  Component Value Date   WBC 11.7 (H) 08/19/2019   HGB 10.8 (L) 08/19/2019   HCT 33.2 (L) 08/19/2019   MCV 81.8 08/19/2019   PLT 155 08/19/2019    Assessment / Plan: IOL d/t severe pre-e, currently asymptomatic and bp's normal. S/P cytotec x 2, last @ 0450, foley bulb out at 0630. Plan pitocin if needed @ 0850.   Labor: s/p cervical ripening Fetal Wellbeing:  Category I Pain Control:  IV pain meds and requesting epidural Pre-eclampsia: on mag, currently asymptomatic, bp's normal I/D:  GBS neg Anticipated MOD:  NSVD  10/19/2019 CNM, WHNP-BC 08/20/2019, 8:04 AM

## 2019-08-20 NOTE — Anesthesia Procedure Notes (Signed)
Epidural Patient location during procedure: OB Start time: 08/20/2019 8:52 AM End time: 08/20/2019 8:54 AM  Staffing Anesthesiologist: Leilani Able, MD Performed: anesthesiologist   Preanesthetic Checklist Completed: patient identified, IV checked, site marked, risks and benefits discussed, surgical consent, monitors and equipment checked, pre-op evaluation and timeout performed  Epidural Patient position: sitting Prep: DuraPrep and site prepped and draped Patient monitoring: continuous pulse ox and blood pressure Approach: midline Location: L3-L4 Injection technique: LOR air  Needle:  Needle type: Tuohy  Needle gauge: 17 G Needle length: 9 cm and 9 Needle insertion depth: 6 cm Catheter type: closed end flexible Catheter size: 19 Gauge Catheter at skin depth: 10 cm Test dose: negative and Other  Assessment Events: blood not aspirated, injection not painful, no injection resistance, no paresthesia and negative IV test  Additional Notes Reason for block:procedure for pain

## 2019-08-20 NOTE — Anesthesia Preprocedure Evaluation (Signed)
Anesthesia Evaluation  Patient identified by MRN, date of birth, ID band Patient awake    Reviewed: Allergy & Precautions, H&P , NPO status , Patient's Chart, lab work & pertinent test results  Airway Mallampati: III  TM Distance: >3 FB Neck ROM: full    Dental no notable dental hx. (+) Teeth Intact   Pulmonary former smoker,    Pulmonary exam normal breath sounds clear to auscultation       Cardiovascular hypertension, Pt. on medications Normal cardiovascular exam Rhythm:regular Rate:Normal     Neuro/Psych negative neurological ROS  negative psych ROS   GI/Hepatic negative GI ROS, Neg liver ROS,   Endo/Other  negative endocrine ROS  Renal/GU negative Renal ROS  negative genitourinary   Musculoskeletal negative musculoskeletal ROS (+)   Abdominal (+) + obese,   Peds  Hematology  (+) Blood dyscrasia, anemia ,   Anesthesia Other Findings   Reproductive/Obstetrics (+) Pregnancy                             Anesthesia Physical Anesthesia Plan  ASA: III  Anesthesia Plan: Epidural   Post-op Pain Management:    Induction:   PONV Risk Score and Plan:   Airway Management Planned:   Additional Equipment:   Intra-op Plan:   Post-operative Plan:   Informed Consent: I have reviewed the patients History and Physical, chart, labs and discussed the procedure including the risks, benefits and alternatives for the proposed anesthesia with the patient or authorized representative who has indicated his/her understanding and acceptance.       Plan Discussed with:   Anesthesia Plan Comments:         Anesthesia Quick Evaluation

## 2019-08-20 NOTE — Progress Notes (Signed)
LABOR PROGRESS NOTE  Kendra Rose is a 19 y.o. G1P0 at [redacted]w[redacted]d  admitted for IOL for pre-e with severe features.  Subjective: Patient currently having pain through her epidural and wants to be redosed.  Objective: BP 126/76   Pulse (!) 106   Temp 98.3 F (36.8 C) (Oral)   Resp 14   Ht 5\' 4"  (1.626 m)   Wt 102.1 kg   LMP 11/30/2018   SpO2 98%   BMI 38.62 kg/m  or  Vitals:   08/20/19 2035 08/20/19 2102 08/20/19 2132 08/20/19 2202  BP: 136/79 138/73 138/80 126/76  Pulse: (!) 105 (!) 104 100 (!) 106  Resp: 15 17 16 14   Temp:      TempSrc:      SpO2:      Weight:      Height:         Dilation: 9 Effacement (%): 90 Cervical Position: Posterior Station: 0 Presentation: Vertex Exam by:: , RN FHT: baseline rate 125, moderate varibility, reactive acel, no decel Toco: q2-4 min  Labs: Lab Results  Component Value Date   WBC 12.3 (H) 08/20/2019   HGB 10.7 (L) 08/20/2019   HCT 33.0 (L) 08/20/2019   MCV 82.9 08/20/2019   PLT 153 08/20/2019    Patient Active Problem List   Diagnosis Date Noted  . Severe preeclampsia, third trimester 08/19/2019  . Supervision of normal first pregnancy 06/23/2019    Assessment / Plan: 19 y.o. G1P0 at [redacted]w[redacted]d here for IOL for pre-e with severe features.  Labor: s/p foley, cytotec and arom around 1300. Currently on pitocin. IUPC in place. Fetal Wellbeing:  Cat 1 Pain Control:  Epidural. RN to call anaesthesia about redosing her. GBS: negative Anticipated MOD:  VD PreE with Severe features: Currently on mag. Most recent BP 140/85. No severe range BP's. Continue to monitor.   26, MD OB Resident 08/20/2019, 10:34 PM

## 2019-08-20 NOTE — Progress Notes (Signed)
Kendra Rose is a 19 y.o. G1P0 at [redacted]w[redacted]d by LMP admitted for induction of labor due to [facility] with SF.  Subjective: Resting well with epidural.   Objective: BP 124/76   Pulse 90   Temp 97.7 F (36.5 C) (Oral)   Resp 16   Ht 5\' 4"  (1.626 m)   Wt 102.1 kg   LMP 11/30/2018   SpO2 98%   BMI 38.62 kg/m  I/O last 3 completed shifts: In: 2232.4 [P.O.:268; I.V.:1964.4] Out: 1200 [Urine:1200] Total I/O In: 1185.4 [P.O.:120; I.V.:1065.4] Out: 1500 [Urine:1500]  FHT:  FHR: 125 bpm, variability: moderate,  accelerations:  Present,  decelerations:  Absent UC:   regular, every 2-6 minutes SVE:   Dilation: 5 Effacement (%): 70 Station: -2, -1 Exam by:: Fatma Rutten cnm AROM with large amount of clear fluid in return  Labs: Lab Results  Component Value Date   WBC 12.3 (H) 08/20/2019   HGB 10.7 (L) 08/20/2019   HCT 33.0 (L) 08/20/2019   MCV 82.9 08/20/2019   PLT 153 08/20/2019    Assessment / Plan: Induction of labor due to preeclampsia,  progressing well on pitocin  Labor: Progressing on Pitocin, will continue to increase Preeclampsia:  no signs or symptoms of toxicity and labs stable Fetal Wellbeing:  Category I Pain Control:  Epidural I/D:  n/a Anticipated MOD:  NSVD  10/20/2019, MSN, CNM 08/20/2019, 12:42 PM

## 2019-08-20 NOTE — Progress Notes (Signed)
LABOR PROGRESS NOTE  Kendra Rose is a 19 y.o. G1P0 at [redacted]w[redacted]d admitted for IOL due to PreE with severe features.  Subjective: Patient doing well. FB still in place.   Objective: BP 140/84   Pulse 96   Temp 98 F (36.7 C) (Oral)   Resp 15   Ht 5\' 4"  (1.626 m)   Wt 102.1 kg   LMP 11/30/2018   SpO2 99%   BMI 38.62 kg/m  or  Vitals:   08/20/19 0102 08/20/19 0201 08/20/19 0302 08/20/19 0403  BP: 134/75 129/67 117/66 140/84  Pulse: 85 91 87 96  Resp: 17 15 15 15   Temp:      TempSrc:      SpO2:      Weight:      Height:        Dilation: 1 Effacement (%): 80 Cervical Position: Posterior Station: -2, -3 Presentation: Vertex(confirmed by ultrasound ) Exam by:: Dr. FHT: baseline rate 110, moderate varibility, reactive acel, no decel Toco: occasional contractions  Labs: Lab Results  Component Value Date   WBC 11.7 (H) 08/19/2019   HGB 10.8 (L) 08/19/2019   HCT 33.2 (L) 08/19/2019   MCV 81.8 08/19/2019   PLT 155 08/19/2019    Patient Active Problem List   Diagnosis Date Noted  . Severe preeclampsia, third trimester 08/19/2019  . Supervision of normal first pregnancy 06/23/2019    Assessment / Plan: 19 y.o. G1P0 at [redacted]w[redacted]d here for IOL due to PreE with severe features.  Labor: Induction with FB placement and cytotec x1. Give another cytotec now with plan for q4h. Will consider pitocin once the FB comes out. Fetal Wellbeing:  Cat 1 Pain Control:  PRN medications with plan to get an epidural GBS: neg Anticipated MOD:  VD PreE w/severe features: Normal labs. Continue to monitor BP for severe range. Continue mag.   26, MD OB Resident 08/20/2019, 4:51 AM

## 2019-08-20 NOTE — Progress Notes (Signed)
Patient ID: Kendra Rose, female   DOB: 09-18-2000, 19 y.o.   MRN: [id number]  Mostly comfortable w/ epidural; has slight H/A still but feels it is due to exhaustion; denies RUQ pain; mag sulfate infusing  BPs 128/87, 124/71, 124/70 FHR 125-130, +accels, no decels, Cat 1 Ctx difficulty to trace in current position; Pit on 37mu/min Cx unchanged (5.5/80/vtx -2, vtx not applied well to cx)  IUP@37 .4wks Pre-e w/ SF IOL process  IUPC inserted without difficulty Will attempt to titrate Pitocin to achieve adequate labor Plan to check cx when MVUs have been adequate at least 2hrs Fioricet for H/A and to hopefully help her get some rest  [name] [facility] 08/20/2019 7:46 PM

## 2019-08-21 ENCOUNTER — Encounter (HOSPITAL_COMMUNITY): Payer: Self-pay | Admitting: Obstetrics and Gynecology

## 2019-08-21 LAB — CBC
HCT: 29.9 % — ABNORMAL LOW (ref 36.0–46.0)
HCT: 26.6 % — ABNORMAL LOW (ref 36.0–46.0)
Hemoglobin: 9.4 g/dL — ABNORMAL LOW (ref 12.0–15.0)
Hemoglobin: 8.7 g/dL — ABNORMAL LOW (ref 12.0–15.0)
MCH: 27.4 pg (ref 26.0–34.0)
MCH: 27 pg (ref 26.0–34.0)
MCHC: 31.4 g/dL (ref 30.0–36.0)
MCHC: 32.7 g/dL (ref 30.0–36.0)
MCV: 87.2 fL (ref 80.0–100.0)
MCV: 82.6 fL (ref 80.0–100.0)
Platelets: 171 10*3/uL (ref 150–400)
Platelets: 154 10*3/uL (ref 150–400)
RBC: 3.43 MIL/uL — ABNORMAL LOW (ref 3.87–5.11)
RBC: 3.22 MIL/uL — ABNORMAL LOW (ref 3.87–5.11)
RDW: 15.5 % (ref 11.5–15.5)
RDW: 15.3 % (ref 11.5–15.5)
WBC: 16.4 10*3/uL — ABNORMAL HIGH (ref 4.0–10.5)
WBC: 18 10*3/uL — ABNORMAL HIGH (ref 4.0–10.5)
nRBC: 0 % (ref 0.0–0.2)
nRBC: 0 % (ref 0.0–0.2)

## 2019-08-21 LAB — DIC (DISSEMINATED INTRAVASCULAR COAGULATION)PANEL
D-Dimer, Quant: 2.11 ug/mL-FEU — ABNORMAL HIGH (ref 0.00–0.50)
Fibrinogen: 663 mg/dL — ABNORMAL HIGH (ref 210–475)
INR: 1.1 (ref 0.8–1.2)
Platelets: 182 10*3/uL (ref 150–400)
Prothrombin Time: 13.8 seconds (ref 11.4–15.2)
Smear Review: NONE SEEN
aPTT: 31 seconds (ref 24–36)

## 2019-08-21 LAB — POSTPARTUM HEMORRHAGE PROTOCOL (BB NOTIFICATION)

## 2019-08-21 MED ORDER — MISOPROSTOL 200 MCG PO TABS
800.0000 ug | ORAL_TABLET | Freq: Once | ORAL | Status: AC
  Administered 2019-08-21: 02:00:00 800 ug via RECTAL

## 2019-08-21 MED ORDER — BENZOCAINE-MENTHOL 20-0.5 % EX AERO
1.0000 "application " | INHALATION_SPRAY | CUTANEOUS | Status: DC | PRN
  Administered 2019-08-21: 1 via TOPICAL
  Filled 2019-08-21: qty 56

## 2019-08-21 MED ORDER — TRANEXAMIC ACID-NACL 1000-0.7 MG/100ML-% IV SOLN: 1000.0000 mg | INTRAVENOUS | Status: DC

## 2019-08-21 MED ORDER — TRANEXAMIC ACID-NACL 1000-0.7 MG/100ML-% IV SOLN
INTRAVENOUS | Status: AC
  Filled 2019-08-21: qty 100

## 2019-08-21 MED ORDER — METHYLERGONOVINE MALEATE 0.2 MG/ML IJ SOLN: 0.2000 mg | INTRAMUSCULAR | Status: DC | PRN

## 2019-08-21 MED ORDER — TETANUS-DIPHTH-ACELL PERTUSSIS 5-2.5-18.5 LF-MCG/0.5 IM SUSP: 0.5000 mL | Freq: Once | INTRAMUSCULAR | Status: DC

## 2019-08-21 MED ORDER — PRENATAL MULTIVITAMIN CH
1.0000 | ORAL_TABLET | Freq: Every day | ORAL | Status: DC
  Administered 2019-08-21 – 2019-08-22 (×2): 1 via ORAL
  Filled 2019-08-21 (×2): qty 1

## 2019-08-21 MED ORDER — DIPHENOXYLATE-ATROPINE 2.5-0.025 MG PO TABS
1.0000 | ORAL_TABLET | Freq: Once | ORAL | Status: AC
  Administered 2019-08-21: 02:00:00 1 via ORAL

## 2019-08-21 MED ORDER — CARBOPROST TROMETHAMINE 250 MCG/ML IM SOLN
INTRAMUSCULAR | Status: AC
  Filled 2019-08-21: qty 1

## 2019-08-21 MED ORDER — COCONUT OIL OIL: 1.0000 "application " | TOPICAL_OIL | Status: DC | PRN

## 2019-08-21 MED ORDER — DIBUCAINE (PERIANAL) 1 % EX OINT: 1.0000 "application " | TOPICAL_OINTMENT | CUTANEOUS | Status: DC | PRN

## 2019-08-21 MED ORDER — IBUPROFEN 600 MG PO TABS
600.0000 mg | ORAL_TABLET | Freq: Four times a day (QID) | ORAL | Status: DC
  Administered 2019-08-21 – 2019-08-23 (×7): 600 mg via ORAL
  Filled 2019-08-21 (×7): qty 1

## 2019-08-21 MED ORDER — WITCH HAZEL-GLYCERIN EX PADS: 1.0000 "application " | MEDICATED_PAD | CUTANEOUS | Status: DC | PRN

## 2019-08-21 MED ORDER — ONDANSETRON HCL 4 MG/2ML IJ SOLN: 4.0000 mg | INTRAMUSCULAR | Status: DC | PRN

## 2019-08-21 MED ORDER — LACTATED RINGERS IV SOLN: INTRAVENOUS | Status: AC

## 2019-08-21 MED ORDER — METHYLERGONOVINE MALEATE 0.2 MG PO TABS: 0.2000 mg | ORAL_TABLET | ORAL | Status: DC | PRN

## 2019-08-21 MED ORDER — MEDROXYPROGESTERONE ACETATE 150 MG/ML IM SUSP
150.0000 mg | Freq: Once | INTRAMUSCULAR | Status: AC
  Administered 2019-08-22: 10:00:00 150 mg via INTRAMUSCULAR
  Filled 2019-08-21 (×2): qty 1

## 2019-08-21 MED ORDER — ONDANSETRON HCL 4 MG PO TABS: 4.0000 mg | ORAL_TABLET | ORAL | Status: DC | PRN

## 2019-08-21 MED ORDER — CARBOPROST TROMETHAMINE 250 MCG/ML IM SOLN
250.0000 ug | INTRAMUSCULAR | Status: DC | PRN
  Administered 2019-08-21: 02:00:00 250 ug via INTRAMUSCULAR

## 2019-08-21 MED ORDER — LACTATED RINGERS IV BOLUS: 1000.0000 mL | Freq: Once | INTRAVENOUS | Status: DC

## 2019-08-21 MED ORDER — MAGNESIUM SULFATE 40 GM/1000ML IV SOLN
2.0000 g/h | INTRAVENOUS | Status: AC
  Administered 2019-08-21 (×2): 2 g/h via INTRAVENOUS
  Filled 2019-08-21: qty 1000

## 2019-08-21 MED ORDER — OXYCODONE HCL 5 MG PO TABS
10.0000 mg | ORAL_TABLET | ORAL | Status: DC | PRN
  Administered 2019-08-21 (×2): 10 mg via ORAL
  Filled 2019-08-21 (×2): qty 2

## 2019-08-21 MED ORDER — DIPHENHYDRAMINE HCL 25 MG PO CAPS: 25.0000 mg | ORAL_CAPSULE | Freq: Four times a day (QID) | ORAL | Status: DC | PRN

## 2019-08-21 MED ORDER — ACETAMINOPHEN 325 MG PO TABS
650.0000 mg | ORAL_TABLET | ORAL | Status: DC | PRN
  Administered 2019-08-21: 07:00:00 650 mg via ORAL
  Filled 2019-08-21: qty 2

## 2019-08-21 MED ORDER — OXYCODONE HCL 5 MG PO TABS: 5.0000 mg | ORAL_TABLET | ORAL | Status: DC | PRN

## 2019-08-21 MED ORDER — SIMETHICONE 80 MG PO CHEW: 80.0000 mg | CHEWABLE_TABLET | ORAL | Status: DC | PRN

## 2019-08-21 MED ORDER — OXYTOCIN 40 UNITS IN NORMAL SALINE INFUSION - SIMPLE MED: 2.5000 [IU]/h | INTRAVENOUS | Status: DC | PRN

## 2019-08-21 MED ORDER — TRANEXAMIC ACID-NACL 1000-0.7 MG/100ML-% IV SOLN
1000.0000 mg | Freq: Once | INTRAVENOUS | Status: AC | PRN
  Administered 2019-08-21: 02:00:00 1000 mg via INTRAVENOUS

## 2019-08-21 MED ORDER — MISOPROSTOL 200 MCG PO TABS
ORAL_TABLET | ORAL | Status: AC
  Filled 2019-08-21: qty 4

## 2019-08-21 MED ORDER — DIPHENOXYLATE-ATROPINE 2.5-0.025 MG PO TABS
ORAL_TABLET | ORAL | Status: AC
  Filled 2019-08-21: qty 1

## 2019-08-21 NOTE — Discharge Summary (Signed)
Postpartum Discharge Summary     Patient Name: Kendra Rose DOB: Dec 09, 2000 MRN: [id number]  Date of admission: 08/19/2019 Delivering Provider: Serita Grammes D   Date of discharge: 08/23/2019  Admitting diagnosis: Severe preeclampsia, third trimester [O14.13] Intrauterine pregnancy: 105w5d    Secondary diagnosis:  Active Problems:   Severe preeclampsia, third trimester  Additional problems: none     Discharge diagnosis: Term Pregnancy Delivered, Preeclampsia (severe) and PPH                                                                                                Post partum procedures:None.  Depo given prior to discharge.  Augmentation: AROM, Pitocin, Cytotec and Foley Balloon  Complications: [id number] Hospital course:  Induction of Labor With Vaginal Delivery   19y.o. yo G1P0 at 375w5das admitted to the hospital 08/19/2019 for induction of labor.  Indication for induction: Preeclampsia (with severe features), after presenting to [facility] for further eval of elevated BPs at home. Her BPs weren't in severe range and her pre-e labs were neg, but she was having H/A, RUQ pain and visual disturbances and so the decision was made to start mag sulfate and induce. Patient had a labor course remarkable for undergoing the usual IOL methods and progressing to delivery approx 24h later. She had a PPH of 1156cc due to uterine atony which eventually responded to multiple uterotonics.  Her bleeding subsequently normalized and no blood was required. Membrane Rupture Time/Date: 12:27 PM ,08/20/2019   Intrapartum Procedures: Episiotomy: None [1]                                         Lacerations:  None [1]  Patient had delivery of a Viable infant.  Information for the patient's newborn:  LoTichina, Koebel0[[id number]]Delivery Method: Vaginal, Spontaneous(Filed from Delivery Summary)    08/21/2019  Details of delivery can be found in separate delivery note.  Patient had a routine  postpartum course.  Blood pressure normalized postpartum and no medications were required. Patient is discharged home 08/23/19. Delivery time: 1:45 AM    Magnesium Sulfate received: Yes BMZ received: No Rhophylac:N/A MMR:N/A Transfusion:No  Physical exam  Vitals:   08/22/19 1941 08/22/19 2301 08/23/19 0627 08/23/19 0628  BP: 127/77 (!) 143/66  124/72  Pulse: (!) 102 (!) 121  (!) 116  Resp: '17 17 17   '$ Temp: 98.3 F (36.8 C) 97.7 F (36.5 C) 98 F (36.7 C)   TempSrc: Oral Oral Oral   SpO2: 99% 98% 98%   Weight:      Height:       General: alert, cooperative and no distress Lochia: appropriate Uterine Fundus: firm Neuro: bilateral patellar DTRs wnl, no clonus DVT Evaluation: No evidence of DVT seen on physical exam. Labs: Lab Results  Component Value Date   WBC 18.0 (H) 08/21/2019   HGB 8.7 (L) 08/21/2019   HCT 26.6 (L) 08/21/2019   MCV 82.6 08/21/2019   PLT 154 08/21/2019  CMP Latest Ref Rng & Units 08/19/2019  Glucose 70 - 99 mg/dL 129(H)  BUN 6 - 20 mg/dL 7  Creatinine 0.44 - 1.00 mg/dL 0.44  Sodium 135 - 145 mmol/L 135  Potassium 3.5 - 5.1 mmol/L 3.4(L)  Chloride 98 - 111 mmol/L 103  CO2 22 - 32 mmol/L 21(L)  Calcium 8.9 - 10.3 mg/dL 8.9  Total Protein 6.5 - 8.1 g/dL 6.7  Total Bilirubin 0.3 - 1.2 mg/dL 0.5  Alkaline Phos 38 - 126 U/L 102  AST 15 - 41 U/L 20  ALT 0 - 44 U/L 11   Edinburgh Score: Edinburgh Postnatal Depression Scale Screening Tool 08/22/2019  I have been able to laugh and see the funny side of things. 0  I have looked forward with enjoyment to things. 0  I have blamed myself unnecessarily when things went wrong. 1  I have been anxious or worried for no good reason. 2  I have felt scared or panicky for no good reason. 0  Things have been getting on top of me. 1  I have been so unhappy that I have had difficulty sleeping. 2  I have felt sad or miserable. 2  I have been so unhappy that I have been crying. 0  The thought of harming myself  has occurred to me. 0  Edinburgh Postnatal Depression Scale Total 8    Discharge instruction: per After Visit Summary and "Baby and Me Booklet".  After visit meds:  Allergies as of 08/23/2019   No Known Allergies     Medication List    TAKE these medications   acetaminophen 500 MG tablet Commonly known as: TYLENOL Take 1,000 mg by mouth every 6 (six) hours as needed for mild pain or headache.   Blood Pressure Monitor Misc For regular home bp monitoring during pregnancy   calcium carbonate 500 MG chewable tablet Commonly known as: TUMS - dosed in mg elemental calcium Chew 2-4 tablets by mouth 2 (two) times daily as needed for indigestion or heartburn.   diphenhydramine-acetaminophen 25-500 MG Tabs tablet Commonly known as: TYLENOL PM Take 2 tablets by mouth at bedtime as needed (For pain and sleep.).   ferrous sulfate 325 (65 FE) MG tablet Commonly known as: FerrouSul Take 1 tablet (325 mg total) by mouth daily with breakfast.   hydrocortisone cream 1 % Apply 1 application topically 3 (three) times daily as needed for itching (For eczema.).   ibuprofen 600 MG tablet Commonly known as: ADVIL Take 1 tablet (600 mg total) by mouth every 6 (six) hours.   prenatal multivitamin Tabs tablet Take 1 tablet by mouth daily at 12 noon.       Diet: routine diet  Activity: Advance as tolerated. Pelvic rest for 6 weeks.   Outpatient follow up:BP check 1wk; PP visit 4wks Follow up Appt: Future Appointments  Date Time Provider [place]  08/28/2019  2:30 PM [facility] NURSE [facility]  09/25/2019  1:50 PM Roma Schanz, CNM [facility]   Follow up Visit:   Please schedule this patient for Postpartum visit in: 4 weeks with the following provider: Any provider Virtual For C/S patients schedule nurse incision check in weeks 2 weeks: no High risk pregnancy complicated by: pre-e Delivery mode:  SVD Anticipated Birth Control:   PP Depo given PP Procedures  needed: BP check- 1wk Schedule Integrated BH visit: no   Newborn Data: Live born female  Birth Weight: 6 lb 9.8 oz (3000 g) APGAR: 5, 8  Newborn Delivery  Birth date/time: 08/21/2019 01:45:00 Delivery type: Vaginal, Spontaneous      Baby Feeding: Bottle Disposition:NICU   08/23/2019 Debbrah Alar, MD

## 2019-08-21 NOTE — Anesthesia Postprocedure Evaluation (Signed)
Anesthesia Post Note  Patient: Kendra Rose  Procedure(s) Performed: AN AD HOC LABOR EPIDURAL     Patient location during evaluation: Mother Baby Anesthesia Type: Epidural Level of consciousness: awake, awake and alert and oriented Pain management: pain level controlled Vital Signs Assessment: post-procedure vital signs reviewed and stable Respiratory status: spontaneous breathing and respiratory function stable Cardiovascular status: blood pressure returned to baseline Postop Assessment: no headache, epidural receding, patient able to bend at knees, adequate PO intake, no backache, no apparent nausea or vomiting and able to ambulate Anesthetic complications: no    Last Vitals:  Vitals:   08/21/19 1224 08/21/19 1225  BP: (!) 108/51   Pulse: (!) 103   Resp: 18   Temp: 36.8 C   SpO2:  100%    Last Pain:  Vitals:   08/21/19 1224  TempSrc: Oral  PainSc:    Pain Goal: Patients Stated Pain Goal: 3 (08/21/19 0634)                 Cleda Clarks

## 2019-08-21 NOTE — Plan of Care (Signed)

## 2019-08-21 NOTE — Progress Notes (Signed)
LABOR PROGRESS NOTE  Mikel Pyon is a 19 y.o. G1P0 at [redacted]w[redacted]d  admitted for IOL for pre-e with severe features.  Subjective: Pain improved with redosing of her epidural but she is starting to feel more contractions again.   Objective: BP (!) 145/92   Pulse (!) 103   Temp 98.4 F (36.9 C) (Oral)   Resp 14   Ht 5\' 4"  (1.626 m)   Wt 102.1 kg   LMP 11/30/2018   SpO2 98%   BMI 38.62 kg/m  or  Vitals:   08/20/19 2332 08/21/19 0002 08/21/19 0030 08/21/19 0102  BP: 120/63 (!) 151/90 (!) 143/78 (!) 145/92  Pulse: 99 (!) 105 (!) 105 (!) 103  Resp: 15 16 15 14   Temp:      TempSrc:      SpO2:      Weight:      Height:         Dilation: Lip/rim Effacement (%): 90 Cervical Position: Posterior Station: 0, Plus 1 Presentation: Vertex Exam by:: , RN FHT: baseline rate 125, moderate varibility, reactive acel, no decel Toco: q2-4 min  Labs: Lab Results  Component Value Date   WBC 12.3 (H) 08/20/2019   HGB 10.7 (L) 08/20/2019   HCT 33.0 (L) 08/20/2019   MCV 82.9 08/20/2019   PLT 153 08/20/2019    Patient Active Problem List   Diagnosis Date Noted  . Severe preeclampsia, third trimester 08/19/2019  . Supervision of normal first pregnancy 06/23/2019    Assessment / Plan: 19 y.o. G1P0 at [redacted]w[redacted]d here for IOL for pre-e with severe features.  Labor: s/p foley, cytotec and arom around 1300. Currently on pitocin. IUPC in place. Continuing to progress. Fetal Wellbeing:  Cat 1 Pain Control:  Epidural. Better after redosing. GBS: negative Anticipated MOD:  VD PreE with Severe features: Currently on mag. Most recent BP 145/92. No severe range BP's. Continue to monitor.   26, MD OB Resident 08/21/2019, 1:10 AM

## 2019-08-21 NOTE — Progress Notes (Signed)
Patient screened out for psychosocial assessment since none of the following apply: °Psychosocial stressors documented in mother or baby's chart °Gestation less than 32 weeks °Code at delivery  °Infant with anomalies °Please contact the Clinical Social Worker if specific needs arise, by MOB's request, or if MOB scores greater than 9/yes to question 10 on Edinburgh Postpartum Depression Screen. ° °Mirza Kidney Boyd-Gilyard, MSW, LCSW °Clinical Social Work °[phone] °  °

## 2019-08-22 ENCOUNTER — Encounter (HOSPITAL_COMMUNITY): Payer: Self-pay | Admitting: Obstetrics and Gynecology

## 2019-08-22 LAB — SURGICAL PATHOLOGY

## 2019-08-22 NOTE — Progress Notes (Signed)
Post Partum Day 1 Subjective: no complaints, up ad lib, voiding and tolerating PO.  She reports her bleeding is appropriate.  She has no headache, scotomata, RUQ pain, chest pain, or shortness of breath.  She is bottle feeding and already received her depo shot.  She has no other symptoms or concerns at this time.  Objective: Blood pressure 131/73, pulse 96, temperature 97.9 F (36.6 C), temperature source Oral, resp. rate 18, height 5\' 4"  (1.626 m), weight 102.1 kg, last menstrual period 11/30/2018, SpO2 100 %, unknown if currently breastfeeding.  Physical Exam:  General: alert, cooperative and no distress Lochia: appropriate Uterine Fundus: firm Neuro: normal patellar DTRs DVT Evaluation: No evidence of DVT seen on physical exam. No cords or calf tenderness.  Recent Labs    08/21/19 0421 08/21/19 0652  HGB 9.4* 8.7*  HCT 29.9* 26.6*    Assessment/Plan: 19 y.o. G1P1001 on PPD#1 s/p NSVD following IOL for preeclampsia with severe features -- Preeclampsia: completed postpartum magnesium, normotensive and asymptomatic on no medication -- Hx of PPH: bleeding now appropriate, H/H stable, asymptomatic -- Rh positive, bottle feeding, depo for contraception -- Anticipate discharge home tomorrow if blood pressures remain stable with 1 week follow up blood pressure check.   LOS: 3 days   10/21/19 08/22/2019, 11:02 AM

## 2019-08-22 NOTE — Progress Notes (Signed)
RN entered room to round on pt.  PT left unit to visit infant in NICU.

## 2019-08-23 ENCOUNTER — Ambulatory Visit: Payer: Self-pay

## 2019-08-23 LAB — RPR: RPR Ser Ql: NONREACTIVE

## 2019-08-23 MED ORDER — IBUPROFEN 600 MG PO TABS: 600.0000 mg | ORAL_TABLET | Freq: Four times a day (QID) | ORAL | 0 refills | Status: DC

## 2019-08-23 MED ORDER — FERROUS SULFATE 325 (65 FE) MG PO TABS: 325.0000 mg | ORAL_TABLET | Freq: Every day | ORAL | 3 refills | Status: DC

## 2019-08-23 NOTE — Plan of Care (Signed)
Pt. Is ready for discharge. Care, medications, and follow up care explained. All questions have been answered. Abdominal binder will be provided as requested.

## 2019-08-23 NOTE — Lactation Note (Signed)
This note was copied from a baby's chart. Lactation Consultation Note  Patient Name: Boy Karen Huhta PPGFQ'M Date: 08/23/2019   Lactation was rounding on NICU floor today, and RN mentioned that Ms. Trott in 60 is interested in pumping. She declined breast feeding, but RN mentioned that Ms. Schaaf would benefit from lactation education. Recommend lactation to check in on 3/14, if possible. Ms. Perrone was not in the room at this time.  Lactation Tools Discussed/Used Tools: Pump   Consult Status Consult Status: Follow-up Date: 08/24/19 Follow-up type: In-patient    Walker Shadow 08/23/2019, 4:35 PM

## 2019-08-24 ENCOUNTER — Ambulatory Visit: Payer: Self-pay

## 2019-08-24 NOTE — Lactation Note (Signed)
This note was copied from a baby's chart. Lactation Consultation Note  Patient Name: Kendra Rose Date: 08/24/2019   Parents at bedside.  Pump in room but mom has never used it.  Mom reports she does not want to breastfeed but has thought about pumping.  Mom reports her breasts are heavy and painful and have hard areas.  Infant now 52 hours old.  Mom reports she does have a pump for home use. Discussed any breastmilk good.  Reviewed some benefits of pumping for infant in NICU.  Dad reports he would like her to do it but she doesn't want too. Could tell mom getting uneasy.  Urged mom to call lactation if we could help her with anything.  Reminded her that if she pumps now it does not mean she has to continue pumping once he comes home.  Urged parents to call lactation as needed  Maternal Data    Feeding Feeding Type: Formula Nipple Type: Nfant Slow Flow (purple)  LATCH Score                   Interventions    Lactation Tools Discussed/Used     Consult Status      Kendra Rose Michaelle Copas 08/24/2019, 10:59 PM

## 2019-08-24 NOTE — Lactation Note (Signed)
This note was copied from a baby's chart. Lactation Consultation Note  Patient Name: Kendra Rose Date: 08/24/2019  Mom not currently at bedside. Lactation will follow up with her.   Maternal Data    Feeding Feeding Type: Formula Nipple Type: Nfant Slow Flow (purple)  LATCH Score                   Interventions    Lactation Tools Discussed/Used     Consult Status      Kendra Rose 08/24/2019, 12:44 PM

## 2019-08-26 ENCOUNTER — Encounter: Payer: Medicaid Other | Admitting: Women's Health

## 2019-08-28 ENCOUNTER — Telehealth: Payer: Medicaid Other

## 2019-09-25 ENCOUNTER — Telehealth: Payer: Medicaid Other | Admitting: Women's Health

## 2019-09-30 ENCOUNTER — Encounter: Payer: Medicaid Other | Admitting: Women's Health

## 2019-09-30 ENCOUNTER — Encounter: Payer: Self-pay | Admitting: Women's Health

## 2019-09-30 ENCOUNTER — Other Ambulatory Visit: Payer: Self-pay

## 2019-09-30 NOTE — Progress Notes (Signed)
Pt not seen. Was scheduled for mychart online visit, but had pre-e during pregnancy and doesn't have bp cuff at home. Will schedule in person pp visit.  This encounter was created in error - please disregard. This encounter was created in error - please disregard.

## 2019-10-10 ENCOUNTER — Telehealth (INDEPENDENT_AMBULATORY_CARE_PROVIDER_SITE_OTHER): Payer: Medicaid Other | Admitting: Women's Health

## 2019-10-10 ENCOUNTER — Encounter: Payer: Self-pay | Admitting: Women's Health

## 2019-10-10 DIAGNOSIS — Z1389 Encounter for screening for other disorder: Secondary | ICD-10-CM | POA: Diagnosis not present

## 2019-10-10 DIAGNOSIS — Z8759 Personal history of other complications of pregnancy, childbirth and the puerperium: Secondary | ICD-10-CM | POA: Diagnosis not present

## 2019-10-10 NOTE — Progress Notes (Signed)
TELEHEALTH VIRTUAL POSTPARTUM VISIT ENCOUNTER NOTE Patient name: Kendra Rose MRN [id number]  Date of birth: Dec 17, 2000  I connected with patient on 10/10/19 at 12:30 PM EDT by MyChart video  and verified that I am speaking with the correct person using two identifiers. Due to COVID-19 recommendations, pt is not currently in our office.    I discussed the limitations, risks, security and privacy concerns of performing an evaluation and management service by telephone and the availability of in person appointments. I also discussed with the patient that there may be a patient responsible charge related to this service. The patient expressed understanding and agreed to proceed.  Chief Complaint:   Postpartum Care  History of Present Illness:   Kendra Rose is a 19 y.o. G90P1001 Caucasian female being evaluated today for a postpartum visit. She is 7 weeks postpartum following a spontaneous vaginal delivery at 37.5 gestational weeks after IOL for severe pre-e. Anesthesia: epidural. Laceration: none. I have fully reviewed the prenatal and intrapartum course. PPH w/ EBL 1167ml-required cytotec, TXA, hemabate. Pregnancy complicated by severe pre-e. Postpartum course has been uncomplicated. Bleeding no bleeding. Bowel function is normal. Bladder function is normal.  Patient is sexually active. Last sexual activity: few days ago.  Contraception method is Depo-Provera injections. Got 1st depo on 3/13 Last pap <21yo.  Results were n/a .  No LMP recorded.  Baby's course has been complicated by 1wk NICU stay d/t feeding issues. Baby is feeding by bottle   Edinburgh Postpartum Depression Screening: negative Edinburgh Postnatal Depression Scale - 10/10/19 1225      Edinburgh Postnatal Depression Scale:  In the Past 7 Days   I have been able to laugh and see the funny side of things.  0    I have looked forward with enjoyment to things.  0    I have blamed myself unnecessarily when things went wrong.   1    I have been anxious or worried for no good reason.  0    I have felt scared or panicky for no good reason.  1    Things have been getting on top of me.  2    I have been so unhappy that I have had difficulty sleeping.  0    I have felt sad or miserable.  1    I have been so unhappy that I have been crying.  1    The thought of harming myself has occurred to me.  0    Edinburgh Postnatal Depression Scale Total  6      Review of Systems:   Pertinent items are noted in HPI Denies Abnormal vaginal discharge w/ itching/odor/irritation, headaches, visual changes, shortness of breath, chest pain, abdominal pain, severe nausea/vomiting, or problems with urination or bowel movements. Pertinent History Reviewed:  Reviewed past medical,surgical, obstetrical and family history.  Reviewed problem list, medications and allergies. OB History  Gravida Para Term Preterm AB Living  1 1 1     1   SAB TAB Ectopic Multiple Live Births        0 1    # Outcome Date GA Lbr Len/2nd Weight Sex Delivery Anes PTL Lv  1 Term 08/21/19 [redacted]w[redacted]d 25:22 / 00:23 6 lb 9.8 oz (3 kg) M Vag-Spont EPI  LIV   Physical Assessment:   Vitals:   10/10/19 1224  BP: 131/75  Pulse: 94  There is no height or weight on file to calculate BMI.  Physical Examination:  General:  Alert, oriented and cooperative.   Mental Status: Normal mood and affect perceived. Normal judgment and thought content.  Rest of physical exam deferred due to type of encounter       No results found for this or any previous visit (from the past 24 hour(s)).  Assessment & Plan:  1) Postpartum exam 2) 7 wks s/p SVB after IOL  3) Bottlefeeding 4) Depression screening 5) Contraception-got depo 3/13 in hospital, needs next appt  Meds: No orders of the defined types were placed in this encounter.   I discussed the assessment and treatment plan with the patient. The patient was provided an opportunity to ask questions and all were answered. The  patient agreed with the plan and demonstrated an understanding of the instructions.   The patient was advised to call back or seek an in-person evaluation/go to the ED for any concerning postpartum symptoms.  I provided 15 minutes of non-face-to-face time during this encounter.  Follow-up: Return for needs depo appt (had dose 3/13), pap after 1/15.   No orders of the defined types were placed in this encounter.   Cheral Marker CNM, [facility] 10/10/2019 12:57 PM

## 2019-11-11 ENCOUNTER — Ambulatory Visit: Payer: Medicaid Other | Admitting: *Deleted

## 2019-11-14 ENCOUNTER — Telehealth: Payer: Self-pay | Admitting: Women's Health

## 2019-11-14 MED ORDER — MEDROXYPROGESTERONE ACETATE 150 MG/ML IM SUSP: 150.0000 mg | INTRAMUSCULAR | 4 refills | Status: DC

## 2019-11-14 NOTE — Telephone Encounter (Signed)
Pt is requesting Depo be sent to her pharmacy. Pt got first does in hospital. Thanks!! JSY

## 2019-11-14 NOTE — Telephone Encounter (Signed)
Needs depo called into [facility] has here at our office THurs

## 2019-11-14 NOTE — Addendum Note (Signed)
Addended by: Cyril Mourning A on: 11/14/2019 01:55 PM   Modules accepted: Orders

## 2019-11-14 NOTE — Telephone Encounter (Signed)
Will rx depo 

## 2019-11-20 ENCOUNTER — Ambulatory Visit (INDEPENDENT_AMBULATORY_CARE_PROVIDER_SITE_OTHER): Payer: Medicaid Other | Admitting: *Deleted

## 2019-11-20 DIAGNOSIS — Z3202 Encounter for pregnancy test, result negative: Secondary | ICD-10-CM | POA: Diagnosis not present

## 2019-11-20 DIAGNOSIS — Z3042 Encounter for surveillance of injectable contraceptive: Secondary | ICD-10-CM

## 2019-11-20 LAB — POCT URINE PREGNANCY: Preg Test, Ur: NEGATIVE

## 2019-11-20 MED ORDER — MEDROXYPROGESTERONE ACETATE 150 MG/ML IM SUSP
150.0000 mg | Freq: Once | INTRAMUSCULAR | Status: AC
  Administered 2019-11-20: 150 mg via INTRAMUSCULAR

## 2019-11-20 NOTE — Progress Notes (Signed)
° °  NURSE VISIT- INJECTION  SUBJECTIVE:  Kendra Rose is a 19 y.o. G64P1001 female here for a Depo Provera for contraception/period management. She is a GYN patient.   OBJECTIVE:  There were no vitals taken for this visit.  Appears well, in no apparent distress  Injection administered in: Left deltoid  Meds ordered this encounter  Medications   medroxyPROGESTERone (DEPO-PROVERA) injection 150 mg    ASSESSMENT: GYN patient Depo Provera for contraception/period management PLAN: Follow-up: in 11-13 weeks for next Depo   [name]  11/20/2019 4:12 PM

## 2020-02-12 ENCOUNTER — Telehealth: Payer: Self-pay | Admitting: Women's Health

## 2020-02-12 ENCOUNTER — Other Ambulatory Visit: Payer: Self-pay | Admitting: Women's Health

## 2020-02-12 ENCOUNTER — Ambulatory Visit: Payer: Medicaid Other

## 2020-02-12 MED ORDER — LO LOESTRIN FE 1 MG-10 MCG / 10 MCG PO TABS: 1.0000 | ORAL_TABLET | Freq: Every day | ORAL | 3 refills | Status: DC

## 2020-02-12 NOTE — Telephone Encounter (Signed)
Called patient and left message that I am returning her call.  

## 2020-02-12 NOTE — Telephone Encounter (Signed)
Pt denies any history of mi/cva/pe migraines. Pt not a smoker. Scheduled for 3 month f/u and advised that she can pick up birth control pills later today.

## 2020-02-12 NOTE — Telephone Encounter (Signed)
Patient called stating that she would like for Kim to call her in some Birth control Pills, Pt has an appointment today for a depo but she does not want this. Please contact pt when done

## 2020-05-04 ENCOUNTER — Encounter (HOSPITAL_COMMUNITY): Payer: Self-pay | Admitting: Emergency Medicine

## 2020-05-04 ENCOUNTER — Other Ambulatory Visit: Payer: Self-pay

## 2020-05-04 ENCOUNTER — Emergency Department (HOSPITAL_COMMUNITY): Payer: Medicaid Other

## 2020-05-04 ENCOUNTER — Emergency Department (HOSPITAL_COMMUNITY)
Admission: EM | Admit: 2020-05-04 | Discharge: 2020-05-04 | Disposition: A | Discharge status: Home / Self Care | Payer: Medicaid Other | Attending: Emergency Medicine | Admitting: Emergency Medicine

## 2020-05-04 DIAGNOSIS — R0602 Shortness of breath: Secondary | ICD-10-CM | POA: Diagnosis not present

## 2020-05-04 DIAGNOSIS — Z87891 Personal history of nicotine dependence: Secondary | ICD-10-CM | POA: Diagnosis not present

## 2020-05-04 DIAGNOSIS — G4489 Other headache syndrome: Secondary | ICD-10-CM | POA: Diagnosis not present

## 2020-05-04 DIAGNOSIS — R55 Syncope and collapse: Secondary | ICD-10-CM | POA: Diagnosis not present

## 2020-05-04 DIAGNOSIS — R079 Chest pain, unspecified: Secondary | ICD-10-CM | POA: Diagnosis not present

## 2020-05-04 DIAGNOSIS — M542 Cervicalgia: Secondary | ICD-10-CM | POA: Diagnosis not present

## 2020-05-04 DIAGNOSIS — R0789 Other chest pain: Secondary | ICD-10-CM | POA: Diagnosis not present

## 2020-05-04 MED ORDER — IBUPROFEN 800 MG PO TABS
800.0000 mg | ORAL_TABLET | Freq: Once | ORAL | Status: AC
  Administered 2020-05-04: 800 mg via ORAL
  Filled 2020-05-04: qty 1
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
## 2020-05-05 ENCOUNTER — Telehealth: Payer: Self-pay | Admitting: *Deleted

## 2020-05-05 NOTE — Telephone Encounter (Signed)
Transition Care Management Unsuccessful Follow-up Telephone Call  Date of discharge and from where:  05/04/20 [facility]  Attempts:  1st Attempt  Reason for unsuccessful TCM follow-up call:  No answer/busy

## 2020-05-07 NOTE — Telephone Encounter (Signed)
Transition Care Management Unsuccessful Follow-up Telephone Call  Date of discharge and from where:  05/04/2020 from [facility]  Attempts:  2nd Attempt  Reason for unsuccessful TCM follow-up call:  Left voice message

## 2020-05-10 NOTE — Telephone Encounter (Signed)
Transition Care Management Unsuccessful Follow-up Telephone Call  Date of discharge and from where:  05/04/2020 from [facility]  Attempts:  3rd Attempt  Reason for unsuccessful TCM follow-up call:  Left voice message

## 2020-05-20 ENCOUNTER — Ambulatory Visit: Payer: Medicaid Other | Admitting: Women's Health

## 2020-06-15 ENCOUNTER — Telehealth: Payer: Self-pay | Admitting: Adult Health

## 2020-06-15 NOTE — Telephone Encounter (Signed)
Pt called concerned with a fever & headache that started at 4:00am yesterday & today (a couple hours ago) both fever & HA stopped when her cycle started  States she stopped her oral BC due to no cycle for a year  Please advise & notify pt

## 2020-06-15 NOTE — Telephone Encounter (Signed)
Called and left message that I am returning her call. Advised that I have sent her a Clinical cytogeneticist message.

## 2021-02-16 DIAGNOSIS — F419 Anxiety disorder, unspecified: Secondary | ICD-10-CM | POA: Diagnosis not present

## 2021-02-16 DIAGNOSIS — Z1331 Encounter for screening for depression: Secondary | ICD-10-CM | POA: Diagnosis not present

## 2021-02-16 DIAGNOSIS — F321 Major depressive disorder, single episode, moderate: Secondary | ICD-10-CM | POA: Diagnosis not present

## 2021-02-16 DIAGNOSIS — R5383 Other fatigue: Secondary | ICD-10-CM | POA: Diagnosis not present

## 2021-02-16 DIAGNOSIS — Z6834 Body mass index (BMI) 34.0-34.9, adult: Secondary | ICD-10-CM | POA: Diagnosis not present

## 2021-06-12 NOTE — L&D Delivery Note (Signed)
[facility]N [facility] Delivery Note  Kendra Rose is a 21 y.o. G2P1001 s/p vag del at [redacted]w[redacted]d. She was admitted for elective IOL, with dx of gHTN intrapartum.   ROM: 5h 14m with clear fluid GBS Status: neg Maximum Maternal Temperature: 98.5  Labor Progress: [name] was admitted on the morning of 7/28 for elective IOL with a dx of gHTN made later that morning. She progressed to complete with Pit/AROM and pushed x .  Delivery Date/Time: July 28th, 2023 at 2328 Delivery: Called to room and patient was complete and pushing. Foley removed. Head delivered LOA. Nuchal cord present x 1, easily reduced prior to delivery. Shoulder and body delivered in usual fashion. Infant with spontaneous cry, placed on mother's abdomen, dried and stimulated. Cord clamped x 2 after 1-minute delay, and cut by FOB. TXA given prophylactically. Cord blood drawn. Placenta delivered spontaneously with gentle cord traction. Fundus returned to boggy after being firm with massage/Pit multiple times; cytotec PR and PO given; EBL at this time was ~300-400cc. Methergine then given and and Dr Para March called to eval for Jada placement due to her previous hx of significant PPH >1L. Labia, perineum, vagina, and cervix inspected in between fundal rubs and found to be intact.   Placenta: spont, intact; to L&D Complications: PPH of 675cc (tx with TXA, cytotec total, Jada, methergine, hemabate, then scheduled methergine series PP) Lacerations: none EBL: 675cc Analgesia: epidural  Postpartum Planning [x]  message to sent to schedule follow-up   Infant: boy  APGARs 8/9  3487g (7lb 11oz)  10/9, CNM  01/07/2022 12:22 AM

## 2021-06-16 ENCOUNTER — Encounter: Payer: Self-pay | Admitting: General Practice

## 2021-06-16 ENCOUNTER — Ambulatory Visit (INDEPENDENT_AMBULATORY_CARE_PROVIDER_SITE_OTHER): Payer: Medicaid Other | Admitting: Family Medicine

## 2021-06-16 ENCOUNTER — Other Ambulatory Visit: Payer: Self-pay

## 2021-06-16 ENCOUNTER — Other Ambulatory Visit (HOSPITAL_COMMUNITY)
Admission: RE | Admit: 2021-06-16 | Discharge: 2021-06-16 | Disposition: A | Discharge status: Home / Self Care | Payer: Medicaid Other | Source: Ambulatory Visit | Attending: Family Medicine | Admitting: Family Medicine

## 2021-06-16 ENCOUNTER — Encounter: Payer: Self-pay | Admitting: Family Medicine

## 2021-06-16 VITALS — BP 111/69 | HR 70 | Wt 196.0 lb

## 2021-06-16 DIAGNOSIS — Z3480 Encounter for supervision of other normal pregnancy, unspecified trimester: Secondary | ICD-10-CM | POA: Diagnosis not present

## 2021-06-16 DIAGNOSIS — Z8759 Personal history of other complications of pregnancy, childbirth and the puerperium: Secondary | ICD-10-CM

## 2021-06-16 DIAGNOSIS — O1413 Severe pre-eclampsia, third trimester: Secondary | ICD-10-CM

## 2021-06-16 DIAGNOSIS — Z3490 Encounter for supervision of normal pregnancy, unspecified, unspecified trimester: Secondary | ICD-10-CM | POA: Insufficient documentation

## 2021-06-16 NOTE — Progress Notes (Signed)
Subjective:  Kendra Rose is a G2P1001 [redacted]w[redacted]d by [place] today being seen today for her first obstetrical visit.  Her obstetrical history is significant for  delivery at 37 weeks due to severe preeclampsia. Had PPH . Patient does intend to breast feed. Pregnancy history fully reviewed.  Patient reports nausea and vomiting.  BP 111/69    Pulse 70    Wt 196 lb (88.9 kg)    LMP 03/21/2021    BMI 33.64 kg/m   HISTORY: OB History  Gravida Para Term Preterm AB Living  2 1 1     1   SAB IAB Ectopic Multiple Live Births        0 1    # Outcome Date GA Lbr Len/2nd Weight Sex Delivery Anes PTL Lv  2 Current           1 Term 08/21/19 [redacted]w[redacted]d 25:22 / 00:23 6 lb 9.8 oz (3 kg) M Vag-Spont EPI  LIV    Past Medical History:  Diagnosis Date   Eczema     No past surgical history on file.  Family History  Problem Relation Age of Onset   Diabetes Father      Exam  BP 111/69    Pulse 70    Wt 196 lb (88.9 kg)    LMP 03/21/2021    BMI 33.64 kg/m   Chaperone present during exam  CONSTITUTIONAL: Well-developed, well-nourished female in no acute distress.  HENT:  Normocephalic, atraumatic, External right and left ear normal. Oropharynx is clear and moist EYES: Conjunctivae and EOM are normal. Pupils are equal, round, and reactive to light. No scleral icterus.  NECK: Normal range of motion, supple, no masses.  Normal thyroid.  CARDIOVASCULAR: Normal heart rate noted, regular rhythm RESPIRATORY: Clear to auscultation bilaterally. Effort and breath sounds normal, no problems with respiration noted. BREASTS: Symmetric in size. No masses, skin changes, nipple drainage, or lymphadenopathy. ABDOMEN: Soft, normal bowel sounds, no distention noted.  No tenderness, rebound or guarding.  PELVIC: Normal appearing external genitalia; normal appearing vaginal mucosa and cervix. No abnormal discharge noted. Normal uterine size, no other palpable masses, no uterine or adnexal tenderness. MUSCULOSKELETAL: Normal  range of motion. No tenderness.  No cyanosis, clubbing, or edema.  2+ distal pulses. SKIN: Skin is warm and dry. No rash noted. Not diaphoretic. No erythema. No pallor. NEUROLOGIC: Alert and oriented to person, place, and time. Normal reflexes, muscle tone coordination. No cranial nerve deficit noted. PSYCHIATRIC: Normal mood and affect. Normal behavior. Normal judgment and thought content.    Assessment:    Pregnancy: G2P1001 Patient Active Problem List   Diagnosis Date Noted   Supervision of other normal pregnancy, antepartum 06/16/2021   History of postpartum hemorrhage 10/10/2019   H/O severe pre-eclampsia 08/19/2019      Plan:   1. Supervision of other normal pregnancy, antepartum FHT and FH normal - CBC/D/Plt+RPR+Rh+ABO+RubIgG... - Culture, OB Urine - Hemoglobin A1c - Cytology - PAP( [place]) - 10/19/2019 MFM OB DETAIL +14 WK; Future - Comprehensive metabolic panel - Protein / creatinine ratio, urine  2. H/O severe pre-eclampsia Start ASA 81mg  daily at 12 weeks CMP, CBC, UP:C as baseline.    3. History of postpartum hemorrhage Aggressive management of second stage of labor.    Initial labs obtained Continue prenatal vitamins Reviewed n/v relief measures and warning s/s to report Reviewed recommended weight gain based on pre-gravid BMI Encouraged well-balanced diet Genetic & carrier screening discussed: requests Panorama, Ultrasound discussed; fetal survey: requested CCNC  completed> form faxed if has or is planning to apply for medicaid The nature of [place] [facility] with multiple MDs and other Advanced Practice Providers was explained to patient; also emphasized that fellows, residents, and students are part of our team.   Problem list reviewed and updated. 75% of 30 min visit spent on counseling and coordination of care.     Levie Heritage 06/16/2021

## 2021-06-17 LAB — COMPREHENSIVE METABOLIC PANEL
ALT: 19 IU/L (ref 0–32)
AST: 21 IU/L (ref 0–40)
Albumin/Globulin Ratio: 1.5 (ref 1.2–2.2)
Albumin: 4.6 g/dL (ref 3.9–5.0)
Alkaline Phosphatase: 76 IU/L (ref 44–121)
BUN/Creatinine Ratio: 11 (ref 9–23)
BUN: 6 mg/dL (ref 6–20)
Bilirubin Total: 0.3 mg/dL (ref 0.0–1.2)
CO2: 22 mmol/L (ref 20–29)
Calcium: 9.2 mg/dL (ref 8.7–10.2)
Chloride: 101 mmol/L (ref 96–106)
Creatinine, Ser: 0.56 mg/dL — ABNORMAL LOW (ref 0.57–1.00)
Globulin, Total: 3.1 g/dL (ref 1.5–4.5)
Glucose: 113 mg/dL — ABNORMAL HIGH (ref 70–99)
Potassium: 3.8 mmol/L (ref 3.5–5.2)
Sodium: 140 mmol/L (ref 134–144)
Total Protein: 7.7 g/dL (ref 6.0–8.5)
eGFR: 133 mL/min/{1.73_m2} (ref >59)

## 2021-06-17 LAB — PROTEIN / CREATININE RATIO, URINE
Creatinine, Urine: 199.9 mg/dL
Creatinine, Urine: 189 mg/dL
Protein, Ur: 18.5 mg/dL
Protein, Ur: 40.5 mg/dL
Protein/Creat Ratio: 93 mg/g creat (ref 0–200)
Protein/Creat Ratio: 214 mg/g creat — ABNORMAL HIGH (ref 0–200)

## 2021-06-17 LAB — CBC/D/PLT+RPR+RH+ABO+RUBIGG...
Antibody Screen: NEGATIVE
Basophils Absolute: 0 10*3/uL (ref 0.0–0.2)
Basos: 0 %
EOS (ABSOLUTE): 0.1 10*3/uL (ref 0.0–0.4)
Eos: 1 %
HCV Ab: <0.1 s/co ratio (ref 0.0–0.9)
HIV Screen 4th Generation wRfx: NONREACTIVE
Hematocrit: 39.5 % (ref 34.0–46.6)
Hemoglobin: 12.8 g/dL (ref 11.1–15.9)
Hepatitis B Surface Ag: NEGATIVE
Immature Grans (Abs): 0 10*3/uL (ref 0.0–0.1)
Immature Granulocytes: 0 %
Lymphocytes Absolute: 1.7 10*3/uL (ref 0.7–3.1)
Lymphs: 23 %
MCH: 26.4 pg — ABNORMAL LOW (ref 26.6–33.0)
MCHC: 32.4 g/dL (ref 31.5–35.7)
MCV: 81 fL (ref 79–97)
Monocytes Absolute: 0.2 10*3/uL (ref 0.1–0.9)
Monocytes: 2 %
Neutrophils Absolute: 5.4 10*3/uL (ref 1.4–7.0)
Neutrophils: 74 %
Platelets: 206 10*3/uL (ref 150–450)
RBC: 4.85 x10E6/uL (ref 3.77–5.28)
RDW: 14.1 % (ref 11.7–15.4)
RPR Ser Ql: NONREACTIVE
Rh Factor: POSITIVE
Rubella Antibodies, IGG: 1.46 index (ref >0.99)
WBC: 7.4 10*3/uL (ref 3.4–10.8)

## 2021-06-17 LAB — HEMOGLOBIN A1C
Est. average glucose Bld gHb Est-mCnc: 100 mg/dL
Hgb A1c MFr Bld: 5.1 % (ref 4.8–5.6)

## 2021-06-17 LAB — HCV INTERPRETATION

## 2021-06-18 LAB — URINE CULTURE, OB REFLEX

## 2021-06-18 LAB — CULTURE, OB URINE

## 2021-06-22 LAB — CYTOLOGY - PAP
Chlamydia: NEGATIVE
Comment: NEGATIVE
Comment: NEGATIVE
Comment: NORMAL
Diagnosis: UNDETERMINED — AB
High risk HPV: POSITIVE — AB
Neisseria Gonorrhea: NEGATIVE

## 2021-06-28 ENCOUNTER — Telehealth: Payer: Self-pay

## 2021-06-28 NOTE — Telephone Encounter (Signed)
Pt called requesting results. Pt made aware that her Pap is abnormal and it will need to be reviewed by the provider.Pt also made aware that her protein creatinine ratio is abnormal and will need to be reviewed by the provider. A message will be sent to the provider. Understanding was  voiced.
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
## 2021-07-14 ENCOUNTER — Ambulatory Visit (INDEPENDENT_AMBULATORY_CARE_PROVIDER_SITE_OTHER): Payer: Medicaid Other | Admitting: Family Medicine

## 2021-07-14 ENCOUNTER — Other Ambulatory Visit: Payer: Self-pay

## 2021-07-14 VITALS — BP 133/83 | HR 87 | Wt 195.0 lb

## 2021-07-14 DIAGNOSIS — Z3A12 12 weeks gestation of pregnancy: Secondary | ICD-10-CM

## 2021-07-14 DIAGNOSIS — O1413 Severe pre-eclampsia, third trimester: Secondary | ICD-10-CM

## 2021-07-14 DIAGNOSIS — Z31430 Encounter of female for testing for genetic disease carrier status for procreative management: Secondary | ICD-10-CM | POA: Diagnosis not present

## 2021-07-14 DIAGNOSIS — Z3480 Encounter for supervision of other normal pregnancy, unspecified trimester: Secondary | ICD-10-CM

## 2021-07-14 DIAGNOSIS — Z8759 Personal history of other complications of pregnancy, childbirth and the puerperium: Secondary | ICD-10-CM

## 2021-07-14 DIAGNOSIS — R87610 Atypical squamous cells of undetermined significance on cytologic smear of cervix (ASC-US): Secondary | ICD-10-CM

## 2021-07-14 DIAGNOSIS — R87810 Cervical high risk human papillomavirus (HPV) DNA test positive: Secondary | ICD-10-CM

## 2021-07-14 MED ORDER — ASPIRIN EC 81 MG PO TBEC: 81.0000 mg | DELAYED_RELEASE_TABLET | Freq: Every day | ORAL | 2 refills | Status: DC

## 2021-07-14 NOTE — Progress Notes (Signed)
° °  PRENATAL VISIT NOTE  Subjective:  Kendra Rose is a 21 y.o. G2P1001 at [redacted]w[redacted]d being seen today for ongoing prenatal care.  She is currently monitored for the following issues for this high-risk pregnancy and has H/O severe pre-eclampsia; History of postpartum hemorrhage; Supervision of other normal pregnancy, antepartum; and ASCUS with positive high risk HPV cervical on their problem list.  Patient reports no complaints.  Contractions: Not present. Vag. Bleeding: None.  Movement: Absent. Denies leaking of fluid.   The following portions of the patient's history were reviewed and updated as appropriate: allergies, current medications, past family history, past medical history, past social history, past surgical history and problem list.   Objective:   Vitals:   07/14/21 1412  BP: 133/83  Pulse: 87  Weight: 195 lb (88.5 kg)    Fetal Status: Fetal Heart Rate (bpm): 145   Movement: Absent     General:  Alert, oriented and cooperative. Patient is in no acute distress.  Skin: Skin is warm and dry. No rash noted.   Cardiovascular: Normal heart rate noted  Respiratory: Normal respiratory effort, no problems with respiration noted  Abdomen: Soft, gravid, appropriate for gestational age.  Pain/Pressure: Absent     Pelvic: Cervical exam deferred        Extremities: Normal range of motion.  Edema: None  Mental Status: Normal mood and affect. Normal behavior. Normal judgment and thought content.   Assessment and Plan:  Pregnancy: G2P1001 at [redacted]w[redacted]d 1. [redacted] weeks gestation of pregnancy  2. Supervision of other normal pregnancy, antepartum FHT and FH normal  3. History of postpartum hemorrhage Recommend TXA during delivery  4. H/O severe pre-eclampsia BP stable. ASA 81mg   5. ASCUS with positive high risk HPV cervical PAP postpartum  Preterm labor symptoms and general obstetric precautions including but not limited to vaginal bleeding, contractions, leaking of fluid and fetal movement  were reviewed in detail with the patient. Please refer to After Visit Summary for other counseling recommendations.   No follow-ups on file.  Future Appointments  Date Time Provider [place]  08/11/2021  3:50 PM Truett Mainland, DO [facility] None  08/29/2021 10:00 AM [facility] NURSE [facility]  08/29/2021 10:15 AM [facility] US2 [facility]  09/08/2021  3:30 PM Truett Mainland, DO [facility] None    Truett Mainland, DO

## 2021-08-11 ENCOUNTER — Encounter: Payer: Medicaid Other | Admitting: Family Medicine

## 2021-08-16 ENCOUNTER — Telehealth (INDEPENDENT_AMBULATORY_CARE_PROVIDER_SITE_OTHER): Payer: Medicaid Other

## 2021-08-16 DIAGNOSIS — Z3A17 17 weeks gestation of pregnancy: Secondary | ICD-10-CM

## 2021-08-16 DIAGNOSIS — Z3480 Encounter for supervision of other normal pregnancy, unspecified trimester: Secondary | ICD-10-CM

## 2021-08-16 DIAGNOSIS — O99352 Diseases of the nervous system complicating pregnancy, second trimester: Secondary | ICD-10-CM

## 2021-08-16 DIAGNOSIS — R519 Headache, unspecified: Secondary | ICD-10-CM

## 2021-08-16 DIAGNOSIS — O09292 Supervision of pregnancy with other poor reproductive or obstetric history, second trimester: Secondary | ICD-10-CM

## 2021-08-16 DIAGNOSIS — O1413 Severe pre-eclampsia, third trimester: Secondary | ICD-10-CM

## 2021-08-16 NOTE — Progress Notes (Signed)
? ?  OBSTETRICS PRENATAL VIRTUAL VISIT ENCOUNTER NOTE ? ?Provider location: [facility] [employer] [facility]  ? ?Patient location: Home ? ?I connected with Kendra Rose on 08/16/21 at 10:55 AM EST by MyChart Video Encounter and verified that I am speaking with the correct person using two identifiers. I discussed the limitations, risks, security and privacy concerns of performing an evaluation and management service virtually and the availability of in person appointments. I also discussed with the patient that there may be a patient responsible charge related to this service. The patient expressed understanding and agreed to proceed. ?Subjective:  ?Kendra Rose is a 21 y.o. G2P1001 at [redacted]w[redacted]d being seen today for ongoing prenatal care.  She is currently monitored for the following issues for this high-risk pregnancy and has H/O severe pre-eclampsia; History of postpartum hemorrhage; Supervision of other normal pregnancy, antepartum; and ASCUS with positive high risk HPV cervical on their problem list. ? ?Patient reports no complaints.  She reports some perception of flutter and denies abdominal cramping or contractions.  She reports some increased urination, but denies issues with pain or discomfort.  No constipation or diarrhea or vaginal concerns.    .  .   . Denies any leaking of fluid.  ? ?The following portions of the patient's history were reviewed and updated as appropriate: allergies, current medications, past family history, past medical history, past social history, past surgical history and problem list.  ? ?Objective:  ?There were no vitals filed for this visit. ? ?Fetal Status:          ? ?General:  Alert, oriented and cooperative. Patient is in no acute distress.  ?Respiratory: Normal respiratory effort, no problems with respiration noted  ?Mental Status: Normal mood and affect. Normal behavior. Normal judgment and thought content.  ?Rest of physical exam deferred due to type of  encounter ? ?Imaging: ?No results found. ? ?Assessment and Plan:  ?Pregnancy: G2P1001 at [redacted]w[redacted]d ? ?1. Supervision of other normal pregnancy, antepartum ?-Anticipatory guidance for upcoming appts. ?-Patient to schedule next appt in 3 weeks for an in-person visit. ?-Patient also scheduled for anatomy US.  Informed of details: March 20th at 10am ? ?2. [redacted] weeks gestation of pregnancy ?-Doing well. ? ?3. H/O severe pre-eclampsia ?-Reports some anxiety. ?-States has HA and ringing in ears with increased activity. Resolves with rest. ?-Discussed usage of tylenol if no improvement with rest. ?-Taking bASA. ?-No home BP Cuff.  Will provide at next visit.   ?-Precautions reviewed ? ?Preterm labor symptoms and general obstetric precautions including but not limited to vaginal bleeding, contractions, leaking of fluid and fetal movement were reviewed in detail with the patient. ?I discussed the assessment and treatment plan with the patient. The patient was provided an opportunity to ask questions and all were answered. The patient agreed with the plan and demonstrated an understanding of the instructions. The patient was advised to call back or seek an in-person office evaluation/go to [facility] at [facility] for any urgent or concerning symptoms. ?Please refer to After Visit Summary for other counseling recommendations.  ? ?I provided 6 minutes of face-to-face time during this encounter. ? ?No follow-ups on file. ? ?Future Appointments  ?Date Time Provider [place]  ?08/29/2021 10:00 AM [facility] NURSE [facility]  ?08/29/2021 10:15 AM [facility] US2 [facility]  ?09/08/2021  3:30 PM Truett Mainland, DO [facility] None  ? ? ?Maryann Conners, CNM ?[facility] [place] ? ?

## 2021-08-29 ENCOUNTER — Other Ambulatory Visit: Payer: Self-pay

## 2021-08-29 ENCOUNTER — Ambulatory Visit: Payer: Medicaid Other | Attending: Family Medicine

## 2021-08-29 ENCOUNTER — Ambulatory Visit: Payer: Medicaid Other | Admitting: *Deleted

## 2021-08-29 ENCOUNTER — Encounter: Payer: Self-pay | Admitting: *Deleted

## 2021-08-29 VITALS — BP 111/52 | HR 85

## 2021-08-29 DIAGNOSIS — Z3480 Encounter for supervision of other normal pregnancy, unspecified trimester: Secondary | ICD-10-CM | POA: Diagnosis not present

## 2021-08-29 DIAGNOSIS — O99212 Obesity complicating pregnancy, second trimester: Secondary | ICD-10-CM | POA: Diagnosis not present

## 2021-08-29 DIAGNOSIS — Z363 Encounter for antenatal screening for malformations: Secondary | ICD-10-CM | POA: Insufficient documentation

## 2021-08-29 DIAGNOSIS — Z6830 Body mass index (BMI) 30.0-30.9, adult: Secondary | ICD-10-CM

## 2021-08-29 DIAGNOSIS — Z3A20 20 weeks gestation of pregnancy: Secondary | ICD-10-CM | POA: Diagnosis not present

## 2021-08-29 DIAGNOSIS — O09292 Supervision of pregnancy with other poor reproductive or obstetric history, second trimester: Secondary | ICD-10-CM | POA: Insufficient documentation

## 2021-08-29 DIAGNOSIS — Z3687 Encounter for antenatal screening for uncertain dates: Secondary | ICD-10-CM | POA: Insufficient documentation

## 2021-09-08 ENCOUNTER — Telehealth (INDEPENDENT_AMBULATORY_CARE_PROVIDER_SITE_OTHER): Payer: Medicaid Other | Admitting: Family Medicine

## 2021-09-08 ENCOUNTER — Encounter: Payer: Medicaid Other | Admitting: Family Medicine

## 2021-09-08 DIAGNOSIS — Z8759 Personal history of other complications of pregnancy, childbirth and the puerperium: Secondary | ICD-10-CM

## 2021-09-08 DIAGNOSIS — H9313 Tinnitus, bilateral: Secondary | ICD-10-CM

## 2021-09-08 DIAGNOSIS — O99352 Diseases of the nervous system complicating pregnancy, second trimester: Secondary | ICD-10-CM

## 2021-09-08 DIAGNOSIS — R87610 Atypical squamous cells of undetermined significance on cytologic smear of cervix (ASC-US): Secondary | ICD-10-CM

## 2021-09-08 DIAGNOSIS — O1413 Severe pre-eclampsia, third trimester: Secondary | ICD-10-CM

## 2021-09-08 DIAGNOSIS — Z3A22 22 weeks gestation of pregnancy: Secondary | ICD-10-CM

## 2021-09-08 DIAGNOSIS — Z3480 Encounter for supervision of other normal pregnancy, unspecified trimester: Secondary | ICD-10-CM

## 2021-09-08 DIAGNOSIS — O09292 Supervision of pregnancy with other poor reproductive or obstetric history, second trimester: Secondary | ICD-10-CM

## 2021-09-08 DIAGNOSIS — O98312 Other infections with a predominantly sexual mode of transmission complicating pregnancy, second trimester: Secondary | ICD-10-CM

## 2021-09-08 DIAGNOSIS — O3442 Maternal care for other abnormalities of cervix, second trimester: Secondary | ICD-10-CM

## 2021-09-08 DIAGNOSIS — R87810 Cervical high risk human papillomavirus (HPV) DNA test positive: Secondary | ICD-10-CM

## 2021-09-08 NOTE — Progress Notes (Signed)
Virtual visit. Armandina Stammer RN  ?

## 2021-09-08 NOTE — Progress Notes (Addendum)
? ?OBSTETRICS PRENATAL VIRTUAL VISIT ENCOUNTER NOTE ? ?Provider location: Center for [employer] at [facility]  ? ?Patient location: Home ? ?I connected with Kendra Rose on 09/08/21 at  3:30 PM EDT by MyChart Video Encounter and verified that I am speaking with the correct person using two identifiers. I discussed the limitations, risks, security and privacy concerns of performing an evaluation and management service virtually and the availability of in person appointments. I also discussed with the patient that there may be a patient responsible charge related to this service. The patient expressed understanding and agreed to proceed. ?Subjective:  ?Kendra Rose is a 21 y.o. G2P1001 at [redacted]w[redacted]d being seen today for ongoing prenatal care.  She is currently monitored for the following issues for this high-risk pregnancy and has H/O severe pre-eclampsia; History of postpartum hemorrhage; Supervision of other normal pregnancy, antepartum; and ASCUS with positive high risk HPV cervical on their problem list. ? ?Patient reports  having ringing in ears, HA, lightheadedness when she has been walking around for a long time. Was suppose to get BP cuff in between last visit and today, but hasn't. Once she stops and rests, symptoms goes away. Is taking her ASA .  Contractions: Not present. Vag. Bleeding: None.  Movement: Present. Denies any leaking of fluid.  ? ?The following portions of the patient's history were reviewed and updated as appropriate: allergies, current medications, past family history, past medical history, past social history, past surgical history and problem list.  ? ?Objective:  ?There were no vitals filed for this visit. ? ?Fetal Status:     Movement: Present    ? ?General:  Alert, oriented and cooperative. Patient is in no acute distress.  ?Respiratory: Normal respiratory effort, no problems with respiration noted  ?Mental Status: Normal mood and affect. Normal behavior. Normal  judgment and thought content.  ?Rest of physical exam deferred due to type of encounter ? ?Imaging: ?[place] MFM OB DETAIL +14 WK ? ?Result Date: 08/29/2021 ?----------------------------------------------------------------------  OBSTETRICS REPORT                       (Signed Final 08/29/2021 01:35 pm) ---------------------------------------------------------------------- Patient Info  ID #:       [id number]                          D.O.B.:  May 03, 2001 (21 yrs)  Name:       Kendra Rose                  Visit Date: 08/29/2021 11:31 am ---------------------------------------------------------------------- Performed By  Attending:        Tama High MD        Secondary Phy.:   [facility] [place]  Performed By:     Jeanene Erb BS,      Address:          [place]  Referred By:      Truett Mainland  [facility]                                                             [place] [facility]                                                             [facility]  Ref. Address:     [place] ---------------------------------------------------------------------- Orders  #  Description                           Code        Ordered By  1  [place] MFM OB DETAIL +14 [place]               [id number]    [place] ----------------------------------------------------------------------  #  Order #                     Accession #                Episode #  1  [id number]                   [id number]                 [id number] ---------------------------------------------------------------------- Indications  Obesity complicating pregnancy, second         O99.212  trimester (BMI 30.8)  Encounter for uncertain dates                  Z36.87  Poor obstetric history: Previous                O09.299  preeclampsia / eclampsia/gestational HTN  [redacted] weeks gestation of pregnancy                Z3A.20  Encounter for antenatal screening for          Z36.3  malformations  Poor obstetrical history (Hx PPH)              O09.299  Low Risk NIPS,Negative Horizon ---------------------------------------------------------------------- Fetal Evaluation  Num Of Fetuses:         1  Fetal Heart Rate(bpm):  155  Cardiac Activity:       Observed  Presentation:  Variable  Placenta:               Posterior  P. Cord Insertion:      Visualized  Amniotic Fluid  AFI FV:      Within normal limits                              Largest Pocket(cm)                              5. ---------------------------------------------------------------------- Biometry  BPD:      48.7  mm     G. Age:  20w 5d         56  %    CI:        75.97   %    70 - 86                                                          FL/HC:      17.2   %    15.9 - 20.3  HC:      177.1  mm     G. Age:  20w 1d         25  %    HC/AC:      1.07        1.06 - 1.25  AC:      166.2  mm     G. Age:  21w 5d         78  %    FL/BPD:     62.4   %  FL:       30.4  mm     G. Age:  19w 3d          9  %    FL/AC:      18.3   %    20 - 24  HUM:      30.3  mm     G. Age:  20w 0d         34  %  CER:      20.9  mm     G. Age:  20w 0d         44  %  NFT:       3.8  mm  LV:        5.3  mm  CM:        2.7  mm  Est. FW:     364  gm    0 lb 13 oz      46  % ---------------------------------------------------------------------- OB History  Gravidity:    2         Term:   1        Prem:   0        SAB:   0  TOP:          0       Ectopic:  0        Living: 1 ---------------------------------------------------------------------- Gestational Age  LMP:           23w 0d        Date:  03/21/21  EDD:   12/26/21  U/S Today:     20w 4d                                        EDD:   01/12/22  Best:          20w 4d     Det. By:  U/S (08/29/21)           EDD:   01/12/22  ---------------------------------------------------------------------- Anatomy  Cranium:               Appears normal         LVOT:                   Not well visualized  Cavum:                 Appears normal         Aortic Arch:            Appears normal  Ventricles:            Appears normal         Ductal Arch:            Appears normal  Choroid Plexus:        Appears normal         Diaphragm:              Appears normal  Cerebellum:            Appears normal         Stomach:                Appears normal, left                                                                        sided  Posterior Fossa:       Appears normal         Abdomen:                Appears normal  Nuchal Fold:           Appears normal         Abdominal Wall:         Appears nml (cord                                                                        insert, abd wall)  Face:                  Appears normal         Cord Vessels:           Appears normal (3                         (orbits and profile)  vessel cord)  Lips:                  Appears normal         Kidneys:                Appear normal  Palate:                Appears normal         Bladder:                Appears normal  Thoracic:              Appears normal         Spine:                  Limited views                                                                        appear normal  Heart:                 Not well visualized    Upper Extremities:      Appears normal  RVOT:                  Appears normal         Lower Extremities:      Appears normal  Other:  Fetus appears to be a female. Heels, IVC, 3VT, and  Hands visualized.          Technically difficult due to maternal habitus and fetal position. ---------------------------------------------------------------------- Cervix Uterus Adnexa  Cervix  Length:            3.4  cm.  Normal appearance by transabdominal scan.  Right Ovary  Within normal limits.  Left Ovary  Within normal limits.  Adnexa   No abnormality visualized. ---------------------------------------------------------------------- Impression  G1 P0.  Patient is here for fetal anatomy scan.  She is unsure  of her LMP date and has not had [place]

## 2021-10-13 ENCOUNTER — Ambulatory Visit (INDEPENDENT_AMBULATORY_CARE_PROVIDER_SITE_OTHER): Payer: Medicaid Other | Admitting: Family Medicine

## 2021-10-13 ENCOUNTER — Encounter: Payer: Self-pay | Admitting: General Practice

## 2021-10-13 VITALS — BP 118/68 | HR 91 | Wt 204.0 lb

## 2021-10-13 DIAGNOSIS — Z3A27 27 weeks gestation of pregnancy: Secondary | ICD-10-CM | POA: Diagnosis not present

## 2021-10-13 DIAGNOSIS — Z23 Encounter for immunization: Secondary | ICD-10-CM

## 2021-10-13 DIAGNOSIS — R87610 Atypical squamous cells of undetermined significance on cytologic smear of cervix (ASC-US): Secondary | ICD-10-CM

## 2021-10-13 DIAGNOSIS — R000 Tachycardia, unspecified: Secondary | ICD-10-CM

## 2021-10-13 DIAGNOSIS — Z3480 Encounter for supervision of other normal pregnancy, unspecified trimester: Secondary | ICD-10-CM

## 2021-10-13 DIAGNOSIS — Z3492 Encounter for supervision of normal pregnancy, unspecified, second trimester: Secondary | ICD-10-CM | POA: Diagnosis not present

## 2021-10-13 DIAGNOSIS — R87810 Cervical high risk human papillomavirus (HPV) DNA test positive: Secondary | ICD-10-CM

## 2021-10-13 DIAGNOSIS — O1413 Severe pre-eclampsia, third trimester: Secondary | ICD-10-CM

## 2021-10-13 NOTE — Progress Notes (Signed)
? ?  PRENATAL VISIT NOTE ? ?Subjective:  ?Kendra Rose is a 21 y.o. G2P1001 at 27w0dbeing seen today for ongoing prenatal care.  She is currently monitored for the following issues for this high-risk pregnancy and has H/O severe pre-eclampsia; History of postpartum hemorrhage; Supervision of other normal pregnancy, antepartum; ASCUS with positive high risk HPV cervical; and Sinus tachycardia on their problem list. ? ?Patient reports  continued tachycardia and palpitations with going from sitting to standing or with minimal exertion. Gets ringing in her ears when this happens. Drinking plenty of fluid .  Contractions: Irritability. Vag. Bleeding: None.  Movement: Present. Denies leaking of fluid.  ? ?The following portions of the patient's history were reviewed and updated as appropriate: allergies, current medications, past family history, past medical history, past social history, past surgical history and problem list.  ? ?Objective:  ? ?Vitals:  ? 10/13/21 0847  ?BP: 118/68  ?Pulse: 91  ?Weight: 204 lb (92.5 kg)  ? ? ?Fetal Status:   Fundal Height: 27 cm Movement: Present    ? ?General:  Alert, oriented and cooperative. Patient is in no acute distress.  ?Skin: Skin is warm and dry. No rash noted.   ?Cardiovascular: Normal heart rate noted  ?Respiratory: Normal respiratory effort, no problems with respiration noted  ?Abdomen: Soft, gravid, appropriate for gestational age.  Pain/Pressure: Absent     ?Pelvic: Cervical exam deferred        ?Extremities: Normal range of motion.  Edema: Trace  ?Mental Status: Normal mood and affect. Normal behavior. Normal judgment and thought content.  ? ?Assessment and Plan:  ?Pregnancy: G2P1001 at 283w0d1. [redacted] weeks gestation of pregnancy ?- CBC ?- Glucose Tolerance, 2 Hours w/1 Hour ?- HIV Antibody (routine testing w rflx) ?- RPR ?- Tdap vaccine greater than or equal to 7yo IM ? ?2. Supervision of other normal pregnancy, antepartum ?FHT and FH normal ? ?3. ASCUS with positive  high risk HPV cervical ?PAP postpartum ? ?4. H/O severe pre-eclampsia ?On ASA 8144mBP normal ? ?5. Sinus tachycardia ?Refer to cardiology. Check labs. ?- Thyroid Panel With TSH ?- Magnesium ?- Comp Met (CMET) ? ?Preterm labor symptoms and general obstetric precautions including but not limited to vaginal bleeding, contractions, leaking of fluid and fetal movement were reviewed in detail with the patient. ?Please refer to After Visit Summary for other counseling recommendations.  ? ?No follow-ups on file. ? ?Future Appointments  ?Date Time Provider DepPlymouth5/25/2023  3:30 PM StiTruett MainlandO [facility] None  ?11/17/2021  3:30 PM StiTruett MainlandO [facility] None  ?12/01/2021  3:10 PM StiTruett MainlandO [facility] None  ?12/15/2021  3:30 PM StiTruett MainlandO [facility] None  ?12/22/2021  3:30 PM Anyanwu, UgoSallyanne HaversD [facility] None  ?12/29/2021 10:35 AM Anyanwu, UgoSallyanne HaversD [facility] None  ?01/05/2022  3:30 PM PraDonnamae JudeD [facility] None  ? ? ?JacTruett MainlandO ?

## 2021-10-14 LAB — CBC
Hematocrit: 34.2 % (ref 34.0–46.6)
Hemoglobin: 11.4 g/dL (ref 11.1–15.9)
MCH: 27 pg (ref 26.6–33.0)
MCHC: 33.3 g/dL (ref 31.5–35.7)
MCV: 81 fL (ref 79–97)
Platelets: 208 10*3/uL (ref 150–450)
RBC: 4.23 x10E6/uL (ref 3.77–5.28)
RDW: 13.5 % (ref 11.7–15.4)
WBC: 10.8 10*3/uL (ref 3.4–10.8)

## 2021-10-14 LAB — HIV ANTIBODY (ROUTINE TESTING W REFLEX): HIV Screen 4th Generation wRfx: NONREACTIVE

## 2021-10-14 LAB — GLUCOSE TOLERANCE, 2 HOURS W/ 1HR
Glucose, 1 hour: 142 mg/dL (ref 70–179)
Glucose, 2 hour: 111 mg/dL (ref 70–152)
Glucose, Fasting: 81 mg/dL (ref 70–91)

## 2021-10-14 LAB — RPR: RPR Ser Ql: NONREACTIVE

## 2021-11-03 ENCOUNTER — Telehealth (INDEPENDENT_AMBULATORY_CARE_PROVIDER_SITE_OTHER): Payer: Medicaid Other | Admitting: Family Medicine

## 2021-11-03 ENCOUNTER — Telehealth: Payer: Self-pay

## 2021-11-03 DIAGNOSIS — O99413 Diseases of the circulatory system complicating pregnancy, third trimester: Secondary | ICD-10-CM

## 2021-11-03 DIAGNOSIS — Z3480 Encounter for supervision of other normal pregnancy, unspecified trimester: Secondary | ICD-10-CM

## 2021-11-03 DIAGNOSIS — O98513 Other viral diseases complicating pregnancy, third trimester: Secondary | ICD-10-CM

## 2021-11-03 DIAGNOSIS — R87610 Atypical squamous cells of undetermined significance on cytologic smear of cervix (ASC-US): Secondary | ICD-10-CM

## 2021-11-03 DIAGNOSIS — R000 Tachycardia, unspecified: Secondary | ICD-10-CM

## 2021-11-03 DIAGNOSIS — Z3A30 30 weeks gestation of pregnancy: Secondary | ICD-10-CM

## 2021-11-03 DIAGNOSIS — Z8759 Personal history of other complications of pregnancy, childbirth and the puerperium: Secondary | ICD-10-CM

## 2021-11-03 DIAGNOSIS — O1413 Severe pre-eclampsia, third trimester: Secondary | ICD-10-CM

## 2021-11-03 DIAGNOSIS — O3443 Maternal care for other abnormalities of cervix, third trimester: Secondary | ICD-10-CM

## 2021-11-03 DIAGNOSIS — R87810 Cervical high risk human papillomavirus (HPV) DNA test positive: Secondary | ICD-10-CM

## 2021-11-03 DIAGNOSIS — O09293 Supervision of pregnancy with other poor reproductive or obstetric history, third trimester: Secondary | ICD-10-CM

## 2021-11-03 NOTE — Telephone Encounter (Signed)
Called patient to begin virtual visit and voicemail box is full. Armandina Stammer RN

## 2021-11-03 NOTE — Progress Notes (Signed)
   OBSTETRICS PRENATAL VIRTUAL VISIT ENCOUNTER NOTE  Provider location: [facility] for [place] at [facility]   Patient location: Home  I connected with Kendra Rose on 11/03/21 at  3:30 PM EDT by MyChart Video Encounter and verified that I am speaking with the correct person using two identifiers. I discussed the limitations, risks, security and privacy concerns of performing an evaluation and management service virtually and the availability of in person appointments. I also discussed with the patient that there may be a patient responsible charge related to this service. The patient expressed understanding and agreed to proceed. Subjective:  Kendra Rose is a 21 y.o. G2P1001 at [redacted]w[redacted]d being seen today for ongoing prenatal care.  She is currently monitored for the following issues for this high-risk pregnancy and has H/O severe pre-eclampsia; History of postpartum hemorrhage; Supervision of other normal pregnancy, antepartum; ASCUS with positive high risk HPV cervical; and Sinus tachycardia on their problem list.  Patient reports  less tachycardic episodes. .   .  .   . Denies any leaking of fluid.   The following portions of the patient's history were reviewed and updated as appropriate: allergies, current medications, past family history, past medical history, past social history, past surgical history and problem list.   Objective:  There were no vitals filed for this visit.  Fetal Status:           General:  Alert, oriented and cooperative. Patient is in no acute distress.  Respiratory: Normal respiratory effort, no problems with respiration noted  Mental Status: Normal mood and affect. Normal behavior. Normal judgment and thought content.  Rest of physical exam deferred due to type of encounter  Imaging: No results found.  Assessment and Plan:  Pregnancy: G2P1001 at [redacted]w[redacted]d 1. Supervision of other normal pregnancy, antepartum Good fetal movement - AMB Referral  to [place]  2. ASCUS with positive high risk HPV cervical Rpt PAP  3. Sinus tachycardia Has not heard for Cardiology. Will refer - AMB Referral to [place]  4. History of postpartum hemorrhage Aggressive management of 3rd stage  5. H/O severe pre-eclampsia ASA 81mg   Preterm labor symptoms and general obstetric precautions including but not limited to vaginal bleeding, contractions, leaking of fluid and fetal movement were reviewed in detail with the patient. I discussed the assessment and treatment plan with the patient. The patient was provided an opportunity to ask questions and all were answered. The patient agreed with the plan and demonstrated an understanding of the instructions. The patient was advised to call back or seek an in-person office evaluation/go to [facility]U at [facility] for any urgent or concerning symptoms. Please refer to After Visit Summary for other counseling recommendations.   I provided 7 minutes of face-to-face time during this encounter.  No follow-ups on file.  Future Appointments  Date Time Provider [place]  11/17/2021  3:30 PM Truett Mainland, DO [facility] None  12/01/2021  3:10 PM Truett Mainland, DO [facility] None  12/15/2021  3:30 PM Truett Mainland, DO [facility] None  12/22/2021  3:30 PM Anyanwu, Sallyanne Havers, MD [facility] None  12/29/2021 10:35 AM Anyanwu, Sallyanne Havers, MD [facility] None  01/05/2022  3:30 PM Donnamae Jude, MD [facility] None    [place] for [employer], [place]

## 2021-11-17 ENCOUNTER — Ambulatory Visit (INDEPENDENT_AMBULATORY_CARE_PROVIDER_SITE_OTHER): Payer: Medicaid Other | Admitting: Family Medicine

## 2021-11-17 VITALS — BP 120/65 | HR 88 | Wt 206.0 lb

## 2021-11-17 DIAGNOSIS — O1413 Severe pre-eclampsia, third trimester: Secondary | ICD-10-CM

## 2021-11-17 DIAGNOSIS — Z3A32 32 weeks gestation of pregnancy: Secondary | ICD-10-CM

## 2021-11-17 DIAGNOSIS — F419 Anxiety disorder, unspecified: Secondary | ICD-10-CM

## 2021-11-17 DIAGNOSIS — R87810 Cervical high risk human papillomavirus (HPV) DNA test positive: Secondary | ICD-10-CM

## 2021-11-17 DIAGNOSIS — Z3480 Encounter for supervision of other normal pregnancy, unspecified trimester: Secondary | ICD-10-CM

## 2021-11-17 DIAGNOSIS — R87610 Atypical squamous cells of undetermined significance on cytologic smear of cervix (ASC-US): Secondary | ICD-10-CM

## 2021-11-17 DIAGNOSIS — Z8759 Personal history of other complications of pregnancy, childbirth and the puerperium: Secondary | ICD-10-CM

## 2021-11-17 DIAGNOSIS — R000 Tachycardia, unspecified: Secondary | ICD-10-CM

## 2021-11-17 MED ORDER — HYDROXYZINE HCL 25 MG PO TABS: 12.5000 mg | ORAL_TABLET | Freq: Four times a day (QID) | ORAL | 2 refills | Status: DC | PRN

## 2021-11-17 NOTE — Progress Notes (Signed)
   PRENATAL VISIT NOTE  Subjective:  Kendra Rose is a 21 y.o. G2P1001 at [redacted]w[redacted]d being seen today for ongoing prenatal care.  She is currently monitored for the following issues for this high-risk pregnancy and has H/O severe pre-eclampsia; History of postpartum hemorrhage; Supervision of other normal pregnancy, antepartum; ASCUS with positive high risk HPV cervical; and Sinus tachycardia on their problem list.  Patient reports  having 2-3 episodes of tachycardia a day. Feels like her heart is racing and pounding. Lasts for 10-15 minutes. Sometimes sitting down helps, but sometimes persists. Occasionally, has increased heart rate at night. Does have trouble sleeping occasionally.    Contractions: Irritability. Vag. Bleeding: None.  Movement: Present. Denies leaking of fluid.   The following portions of the patient's history were reviewed and updated as appropriate: allergies, current medications, past family history, past medical history, past social history, past surgical history and problem list.   Objective:   Vitals:   11/17/21 1538  BP: 120/65  Pulse: 88  Weight: 206 lb (93.4 kg)    Fetal Status: Fetal Heart Rate (bpm): 139   Movement: Present     General:  Alert, oriented and cooperative. Patient is in no acute distress.  Skin: Skin is warm and dry. No rash noted.   Cardiovascular: Normal heart rate noted  Respiratory: Normal respiratory effort, no problems with respiration noted  Abdomen: Soft, gravid, appropriate for gestational age.  Pain/Pressure: Present     Pelvic: Cervical exam deferred        Extremities: Normal range of motion.  Edema: Trace  Mental Status: Normal mood and affect. Normal behavior. Normal judgment and thought content.   Assessment and Plan:  Pregnancy: G2P1001 at [redacted]w[redacted]d 1. [redacted] weeks gestation of pregnancy  2. Supervision of other normal pregnancy, antepartum FHT and FH normal.  3. ASCUS with positive high risk HPV cervical PAP in 1 year  4. H/O  severe pre-eclampsia ASA 81mg . BP normal  5. History of postpartum hemorrhage Aggressive management of 3 stage.  6. Sinus tachycardia Patient referred to OB/Cards previously.  7. Anxiety Vistaril prescribed.  Preterm labor symptoms and general obstetric precautions including but not limited to vaginal bleeding, contractions, leaking of fluid and fetal movement were reviewed in detail with the patient. Please refer to After Visit Summary for other counseling recommendations.   No follow-ups on file.  Future Appointments  Date Time Provider [place]  12/01/2021  3:10 PM Truett Mainland, DO [facility] None  12/15/2021  3:30 PM Truett Mainland, DO [facility] None  12/22/2021  3:30 PM Anyanwu, Sallyanne Havers, MD [facility] None  12/29/2021 10:35 AM Anyanwu, Sallyanne Havers, MD [facility] None  01/05/2022  3:30 PM Donnamae Jude, MD [facility] None    Truett Mainland, DO

## 2021-11-23 ENCOUNTER — Encounter: Payer: Self-pay | Admitting: *Deleted

## 2021-12-01 ENCOUNTER — Telehealth (INDEPENDENT_AMBULATORY_CARE_PROVIDER_SITE_OTHER): Payer: Medicaid Other | Admitting: Family Medicine

## 2021-12-01 DIAGNOSIS — O99413 Diseases of the circulatory system complicating pregnancy, third trimester: Secondary | ICD-10-CM

## 2021-12-01 DIAGNOSIS — O98313 Other infections with a predominantly sexual mode of transmission complicating pregnancy, third trimester: Secondary | ICD-10-CM

## 2021-12-01 DIAGNOSIS — R87810 Cervical high risk human papillomavirus (HPV) DNA test positive: Secondary | ICD-10-CM

## 2021-12-01 DIAGNOSIS — Z8759 Personal history of other complications of pregnancy, childbirth and the puerperium: Secondary | ICD-10-CM

## 2021-12-01 DIAGNOSIS — Z3480 Encounter for supervision of other normal pregnancy, unspecified trimester: Secondary | ICD-10-CM

## 2021-12-01 DIAGNOSIS — O09293 Supervision of pregnancy with other poor reproductive or obstetric history, third trimester: Secondary | ICD-10-CM

## 2021-12-01 DIAGNOSIS — R000 Tachycardia, unspecified: Secondary | ICD-10-CM

## 2021-12-01 DIAGNOSIS — O1413 Severe pre-eclampsia, third trimester: Secondary | ICD-10-CM

## 2021-12-01 DIAGNOSIS — Z3A34 34 weeks gestation of pregnancy: Secondary | ICD-10-CM

## 2021-12-01 DIAGNOSIS — R87610 Atypical squamous cells of undetermined significance on cytologic smear of cervix (ASC-US): Secondary | ICD-10-CM

## 2021-12-01 NOTE — Progress Notes (Signed)
   OBSTETRICS PRENATAL VIRTUAL VISIT ENCOUNTER NOTE  Provider location: [facility] for [facility]   Patient location: Home  I connected with Kendra Rose on 12/01/21 at  3:10 PM EDT by MyChart Video Encounter and verified that I am speaking with the correct person using two identifiers. I discussed the limitations, risks, security and privacy concerns of performing an evaluation and management service virtually and the availability of in person appointments. I also discussed with the patient that there may be a patient responsible charge related to this service. The patient expressed understanding and agreed to proceed. Subjective:  Kendra Rose is a 21 y.o. G2P1001 at [redacted]w[redacted]d being seen today for ongoing prenatal care.  She is currently monitored for the following issues for this low-risk pregnancy and has H/O severe pre-eclampsia; History of postpartum hemorrhage; Supervision of other normal pregnancy, antepartum; ASCUS with positive high risk HPV cervical; and Sinus tachycardia on their problem list.  Patient reports no complaints.  Contractions: Irritability. Vag. Bleeding: None.  Movement: Present. Denies any leaking of fluid.   The following portions of the patient's history were reviewed and updated as appropriate: allergies, current medications, past family history, past medical history, past social history, past surgical history and problem list.   Objective:  There were no vitals filed for this visit.  Fetal Status:     Movement: Present     General:  Alert, oriented and cooperative. Patient is in no acute distress.  Respiratory: Normal respiratory effort, no problems with respiration noted  Mental Status: Normal mood and affect. Normal behavior. Normal judgment and thought content.  Rest of physical exam deferred due to type of encounter  Imaging: No results found.  Assessment and Plan:  Pregnancy: G2P1001 at [redacted]w[redacted]d 1. Supervision of other normal  pregnancy, antepartum Good fetal movement  2. History of postpartum hemorrhage Active management of third stage  3. H/O severe pre-eclampsia On ASA 81mg   4. Sinus tachycardia Referred to OB/Cards  5. ASCUS with positive high risk HPV cervical PAP in 1 year   Preterm labor symptoms and general obstetric precautions including but not limited to vaginal bleeding, contractions, leaking of fluid and fetal movement were reviewed in detail with the patient. I discussed the assessment and treatment plan with the patient. The patient was provided an opportunity to ask questions and all were answered. The patient agreed with the plan and demonstrated an understanding of the instructions. The patient was advised to call back or seek an in-person office evaluation/go to MAU at [facility] for any urgent or concerning symptoms. Please refer to After Visit Summary for other counseling recommendations.   I provided 7 minutes of face-to-face time during this encounter.  No follow-ups on file.  Future Appointments  Date Time Provider Department Center  12/15/2021  3:30 PM 02/15/2022, DO [facility] None  12/22/2021  3:30 PM 12/24/2021, DO [facility] None  12/29/2021 10:35 AM Anyanwu, 12/31/2021, MD [facility] None  01/05/2022  3:30 PM 01/07/2022, MD [facility] None    Reva Bores, DO Center for [facility], [facility]

## 2021-12-15 ENCOUNTER — Ambulatory Visit (INDEPENDENT_AMBULATORY_CARE_PROVIDER_SITE_OTHER): Payer: Medicaid Other | Admitting: Family Medicine

## 2021-12-15 ENCOUNTER — Other Ambulatory Visit (HOSPITAL_COMMUNITY)
Admission: RE | Admit: 2021-12-15 | Discharge: 2021-12-15 | Disposition: A | Discharge status: Home / Self Care | Payer: Medicaid Other | Source: Ambulatory Visit | Attending: Family Medicine | Admitting: Family Medicine

## 2021-12-15 VITALS — BP 125/84 | HR 97 | Wt 211.0 lb

## 2021-12-15 DIAGNOSIS — R87610 Atypical squamous cells of undetermined significance on cytologic smear of cervix (ASC-US): Secondary | ICD-10-CM

## 2021-12-15 DIAGNOSIS — R87810 Cervical high risk human papillomavirus (HPV) DNA test positive: Secondary | ICD-10-CM

## 2021-12-15 DIAGNOSIS — Z3A36 36 weeks gestation of pregnancy: Secondary | ICD-10-CM | POA: Diagnosis not present

## 2021-12-15 DIAGNOSIS — Z3480 Encounter for supervision of other normal pregnancy, unspecified trimester: Secondary | ICD-10-CM

## 2021-12-15 DIAGNOSIS — O1413 Severe pre-eclampsia, third trimester: Secondary | ICD-10-CM

## 2021-12-15 DIAGNOSIS — Z3493 Encounter for supervision of normal pregnancy, unspecified, third trimester: Secondary | ICD-10-CM | POA: Insufficient documentation

## 2021-12-15 DIAGNOSIS — Z8759 Personal history of other complications of pregnancy, childbirth and the puerperium: Secondary | ICD-10-CM

## 2021-12-15 NOTE — Progress Notes (Signed)
   PRENATAL VISIT NOTE  Subjective:  Kendra Rose is a 20 y.o. G2P1001 at [redacted]w[redacted]d being seen today for ongoing prenatal care.  She is currently monitored for the following issues for this low-risk pregnancy and has H/O severe pre-eclampsia; History of postpartum hemorrhage; Supervision of other normal pregnancy, antepartum; ASCUS with positive high risk HPV cervical; and Sinus tachycardia on their problem list.  Patient reports  reports high BP at home consistently with home cuff of 150s/90s. HA, spots in her vision, swelling .  Contractions: Irritability. Vag. Bleeding: None.  Movement: Present. Denies leaking of fluid.   The following portions of the patient's history were reviewed and updated as appropriate: allergies, current medications, past family history, past medical history, past social history, past surgical history and problem list.   Objective:   Vitals:   12/15/21 1544  BP: 125/84  Pulse: 97  Weight: 211 lb (95.7 kg)    Fetal Status: Fetal Heart Rate (bpm): 147   Movement: Present     General:  Alert, oriented and cooperative. Patient is in no acute distress.  Skin: Skin is warm and dry. No rash noted.   Cardiovascular: Normal heart rate noted  Respiratory: Normal respiratory effort, no problems with respiration noted  Abdomen: Soft, gravid, appropriate for gestational age.  Pain/Pressure: Present     Pelvic: Cervical exam deferred        Extremities: Normal range of motion.  Edema: None  Mental Status: Normal mood and affect. Normal behavior. Normal judgment and thought content.   Assessment and Plan:  Pregnancy: G2P1001 at [redacted]w[redacted]d 1. [redacted] weeks gestation of pregnancy - Culture, beta strep (group b only) - GC/Chlamydia probe amp ([place])not at [facility]  2. Supervision of other normal pregnancy, antepartum FHT and FH normal  3. H/O severe pre-eclampsia BPs here have always been normal. Recommended that she bring her cuff in to see if it is accurate.  4. ASCUS with  positive high risk HPV cervical Rpt PAP in 1 year  5. History of postpartum hemorrhage Aggressive management with 3rd stage   Preterm labor symptoms and general obstetric precautions including but not limited to vaginal bleeding, contractions, leaking of fluid and fetal movement were reviewed in detail with the patient. Please refer to After Visit Summary for other counseling recommendations.   No follow-ups on file.  Future Appointments  Date Time Provider Department Center  12/22/2021  3:30 PM Levie Heritage, DO [facility] None  12/29/2021 10:35 AM Anyanwu, Jethro Bastos, MD [facility] None  01/05/2022  3:30 PM Reva Bores, MD [facility] None    Levie Heritage, DO

## 2021-12-19 LAB — GC/CHLAMYDIA PROBE AMP (~~LOC~~) NOT AT ARMC
Chlamydia: NEGATIVE
Comment: NEGATIVE
Comment: NORMAL
Neisseria Gonorrhea: NEGATIVE

## 2021-12-19 LAB — CULTURE, BETA STREP (GROUP B ONLY): Strep Gp B Culture: NEGATIVE

## 2021-12-20 ENCOUNTER — Encounter (HOSPITAL_COMMUNITY): Payer: Self-pay | Admitting: Obstetrics & Gynecology

## 2021-12-20 ENCOUNTER — Inpatient Hospital Stay (HOSPITAL_COMMUNITY)
Admission: AD | Admit: 2021-12-20 | Discharge: 2021-12-20 | Disposition: A | Discharge status: Home / Self Care | Payer: Medicaid Other | Attending: Obstetrics & Gynecology | Admitting: Obstetrics & Gynecology

## 2021-12-20 DIAGNOSIS — K219 Gastro-esophageal reflux disease without esophagitis: Secondary | ICD-10-CM | POA: Diagnosis not present

## 2021-12-20 DIAGNOSIS — O99613 Diseases of the digestive system complicating pregnancy, third trimester: Secondary | ICD-10-CM | POA: Insufficient documentation

## 2021-12-20 DIAGNOSIS — N898 Other specified noninflammatory disorders of vagina: Secondary | ICD-10-CM | POA: Diagnosis not present

## 2021-12-20 DIAGNOSIS — M545 Low back pain, unspecified: Secondary | ICD-10-CM | POA: Insufficient documentation

## 2021-12-20 DIAGNOSIS — Z87891 Personal history of nicotine dependence: Secondary | ICD-10-CM | POA: Diagnosis not present

## 2021-12-20 DIAGNOSIS — Z3689 Encounter for other specified antenatal screening: Secondary | ICD-10-CM

## 2021-12-20 DIAGNOSIS — Z3A36 36 weeks gestation of pregnancy: Secondary | ICD-10-CM | POA: Insufficient documentation

## 2021-12-20 DIAGNOSIS — O26893 Other specified pregnancy related conditions, third trimester: Secondary | ICD-10-CM | POA: Diagnosis present

## 2021-12-20 DIAGNOSIS — R102 Pelvic and perineal pain: Secondary | ICD-10-CM | POA: Diagnosis not present

## 2021-12-20 DIAGNOSIS — O09293 Supervision of pregnancy with other poor reproductive or obstetric history, third trimester: Secondary | ICD-10-CM | POA: Insufficient documentation

## 2021-12-20 LAB — WET PREP, GENITAL
Clue Cells Wet Prep HPF POC: NONE SEEN
Sperm: NONE SEEN
Trich, Wet Prep: NONE SEEN
WBC, Wet Prep HPF POC: <10 (ref <10)
Yeast Wet Prep HPF POC: NONE SEEN

## 2021-12-20 LAB — URINALYSIS, ROUTINE W REFLEX MICROSCOPIC
Bilirubin Urine: NEGATIVE
Glucose, UA: NEGATIVE mg/dL
Hgb urine dipstick: NEGATIVE
Ketones, ur: NEGATIVE mg/dL
Nitrite: NEGATIVE
Protein, ur: NEGATIVE mg/dL
Specific Gravity, Urine: 1.024 (ref 1.005–1.030)
pH: 6 (ref 5.0–8.0)

## 2021-12-20 LAB — COMPREHENSIVE METABOLIC PANEL
ALT: 14 U/L (ref 0–44)
AST: 21 U/L (ref 15–41)
Albumin: 2.8 g/dL — ABNORMAL LOW (ref 3.5–5.0)
Alkaline Phosphatase: 116 U/L (ref 38–126)
Anion gap: 13 (ref 5–15)
BUN: 6 mg/dL (ref 6–20)
CO2: 24 mmol/L (ref 22–32)
Calcium: 9.4 mg/dL (ref 8.9–10.3)
Chloride: 103 mmol/L (ref 98–111)
Creatinine, Ser: 0.51 mg/dL (ref 0.44–1.00)
GFR, Estimated: >60 mL/min (ref >60)
Glucose, Bld: 97 mg/dL (ref 70–99)
Potassium: 3.8 mmol/L (ref 3.5–5.1)
Sodium: 140 mmol/L (ref 135–145)
Total Bilirubin: 0.2 mg/dL — ABNORMAL LOW (ref 0.3–1.2)
Total Protein: 7 g/dL (ref 6.5–8.1)

## 2021-12-20 LAB — CBC
HCT: 34.2 % — ABNORMAL LOW (ref 36.0–46.0)
Hemoglobin: 11 g/dL — ABNORMAL LOW (ref 12.0–15.0)
MCH: 26 pg (ref 26.0–34.0)
MCHC: 32.2 g/dL (ref 30.0–36.0)
MCV: 80.9 fL (ref 80.0–100.0)
Platelets: 166 10*3/uL (ref 150–400)
RBC: 4.23 MIL/uL (ref 3.87–5.11)
RDW: 14.7 % (ref 11.5–15.5)
WBC: 12.3 10*3/uL — ABNORMAL HIGH (ref 4.0–10.5)
nRBC: 0 % (ref 0.0–0.2)

## 2021-12-20 LAB — LIPASE, BLOOD: Lipase: 27 U/L (ref 11–51)

## 2021-12-20 LAB — AMNISURE RUPTURE OF MEMBRANE (ROM) NOT AT ARMC: Amnisure ROM: NEGATIVE

## 2021-12-20 LAB — PROTEIN / CREATININE RATIO, URINE
Creatinine, Urine: 160 mg/dL
Protein Creatinine Ratio: 0.15 mg/mg{Cre} (ref 0.00–0.15)
Total Protein, Urine: 24 mg/dL

## 2021-12-20 MED ORDER — LACTATED RINGERS IV SOLN: Freq: Once | INTRAVENOUS | Status: AC

## 2021-12-20 MED ORDER — ACETAMINOPHEN 500 MG PO TABS
1000.0000 mg | ORAL_TABLET | Freq: Once | ORAL | Status: AC
  Administered 2021-12-20: 1000 mg via ORAL
  Filled 2021-12-20: qty 2

## 2021-12-20 MED ORDER — OMEPRAZOLE MAGNESIUM 20 MG PO TBEC: 20.0000 mg | DELAYED_RELEASE_TABLET | Freq: Every day | ORAL | 0 refills | Status: AC

## 2021-12-20 MED ORDER — ALUM & MAG HYDROXIDE-SIMETH 200-200-20 MG/5ML PO SUSP
30.0000 mL | Freq: Once | ORAL | Status: AC
  Administered 2021-12-20: 30 mL via ORAL
  Filled 2021-12-20: qty 30

## 2021-12-20 MED ORDER — FAMOTIDINE IN NACL 20-0.9 MG/50ML-% IV SOLN
20.0000 mg | Freq: Once | INTRAVENOUS | Status: AC
  Administered 2021-12-20: 20 mg via INTRAVENOUS
  Filled 2021-12-20: qty 50

## 2021-12-20 MED ORDER — FAMOTIDINE 10 MG PO TABS
10.0000 mg | ORAL_TABLET | Freq: Two times a day (BID) | ORAL | 0 refills | Status: AC
Start: 2021-12-20 — End: 2022-01-19

## 2021-12-20 NOTE — MAU Note (Signed)
Kendra Rose is a 21 y.o. at [redacted]w[redacted]d here in MAU reporting: since last night, has had some severe pain in pelvis.  Has had some high blood pressures.  Really bad back pain, and ongoing heartburn.sharp pain in RUQ, sometimes makes it hard to breath.  Swelling in hands and feet. Denies HA Hx of pre-eclampsia.  Last preg was pretty traumatic, so wanted to get checked.  Has been taking  Baby ASA wince 20 wks. Has been losing plug last few wks. ?wetness today. No bleeding.  Loose stools today. Reports +FM Onset of complaint: last night Pain score: back pain 7, cramping 7, RUQ 9 Vitals:   12/20/21 1749  BP: 137/86  Pulse: 88  Resp: 18  Temp: 97.8 F (36.6 C)  SpO2: 100%     IZT:IWPYK on, reports +FM Lab orders placed from triage:  urine, PCR

## 2021-12-20 NOTE — MAU Provider Note (Signed)
History     CSN: [id number]  Arrival date and time: 12/20/21 1727   Event Date/Time   First Provider Initiated Contact with Patient 12/20/21 1822      Chief Complaint  Patient presents with   Abdominal Pain   Back Pain   swollen hands   Rupture of Membranes   Vaginal Discharge   HPI Kendra Rose is a 21 y.o. G2P1001 at [redacted]w[redacted]d who presents to MAU with multiple complaints:  Elevated BP on home cuff Patient endorses recurrent elevated blood pressures on her home cuff. She was not able to bring her cuff to MAU for comparison. She denies scotomata, headache, focal RUQ pain. She does feel that her hands and feet are swollen bilaterally.  Abdominal and Back Pain These are recurrent problems, worsening over time. Patient states she is "busy chasing after a two year old". Pain score is 7/10 for both her lower abdominal pain and her low back pain. She denies alleviating factors. She has not taken medication for this complaint.  Acid Reflux and Upper abdominal pain This is a recurrent problem, worsening over time. Patient endorses discomfort across the top of her abdomen. Sometimes the pain is "sharp" and makes her stop whatever she is doing to get through it. She is not taking medication for her reflux. She denies blood-tinged sputum. She denies chest pain.   Vaginal Discharge & Rupture of Membranes Uncertain onset, patient reported concern for ROM to MAU registration desk but did not mention to CNM on initial assessment. Upon additional discussion she reports ongoing concern for abnormal vaginal discharge. She denies gush of fluid. She is remote from sexual intercourse.  Patient's OB history is significant for severe Preeclampsia in her previous pregnancy. She verbalizes feeling worried and would like reassurance that everything is ok.   Patient receives care with [facility] and her next appointment is 12/22/2021.  OB History     Gravida  2   Para  1   Term  1   Preterm      AB       Living  1      SAB      IAB      Ectopic      Multiple  0   Live Births  1           Past Medical History:  Diagnosis Date   Eczema    Pregnancy induced hypertension     History reviewed. No pertinent surgical history.  Family History  Problem Relation Age of Onset   Diabetes Father     Social History   Tobacco Use   Smoking status: Former    Packs/day: 0.25    Years: 3.00    Total pack years: 0.75    Types: Cigarettes    Quit date: 02/11/2019    Years since quitting: 2.8   Smokeless tobacco: Never  Vaping Use   Vaping Use: Never used  Substance Use Topics   Alcohol use: Not Currently   Drug use: Never    Allergies: No Known Allergies  Medications Prior to Admission  Medication Sig Dispense Refill Last Dose   aspirin EC 81 MG tablet Take 1 tablet (81 mg total) by mouth daily. Take after 12 weeks for prevention of preeclampsia later in pregnancy 300 tablet 2 12/20/2021   Prenatal Vit-Fe Fumarate-FA (PRENATAL MULTIVITAMIN) TABS tablet Take 1 tablet by mouth daily at 12 noon.   12/20/2021   hydrOXYzine (ATARAX) 25 MG tablet Take 0.5-1 tablets (12.5-25  mg total) by mouth every 6 (six) hours as needed for anxiety. (Patient not taking: Reported on 12/15/2021) 30 tablet 2     Review of Systems  Constitutional:  Positive for fatigue.  Gastrointestinal:  Positive for abdominal pain.  Genitourinary:  Positive for vaginal discharge.  Musculoskeletal:  Positive for back pain.  All other systems reviewed and are negative.  Physical Exam   Blood pressure 131/70, pulse 87, temperature 97.8 F (36.6 C), temperature source Oral, resp. rate 18, height 5\' 4"  (1.626 m), weight 96.4 kg, last menstrual period 03/21/2021, SpO2 97 %.  Physical Exam Vitals and nursing note reviewed. Exam conducted with a chaperone present.  Constitutional:      Appearance: She is well-developed. She is obese.  Cardiovascular:     Rate and Rhythm: Normal rate and regular rhythm.      Pulses: Normal pulses.     Heart sounds: Normal heart sounds.     Comments: Dependent edema symmetrical bilaterally. Non-pitting. WNL for gestational age.  Pulmonary:     Effort: Pulmonary effort is normal.     Breath sounds: Normal breath sounds.  Abdominal:     Tenderness: There is no abdominal tenderness.     Comments: Gravid  Musculoskeletal:     Right lower leg: 2+ Edema present.     Left lower leg: 2+ Edema present.  Skin:    Capillary Refill: Capillary refill takes less than 2 seconds.  Neurological:     Mental Status: She is alert and oriented to person, place, and time.  Psychiatric:        Mood and Affect: Mood is anxious.        Behavior: Behavior normal.     Comments: Patient has the overall demeanor of mild distress and seeming very worried throughout MAU encounter. She is not agitated or inappropriate in any way. Demeanor did not change upon hearing that all surveillance was WNL.     MAU Course  Procedures  MDM --Reactive tracing: baseline 125, mod var, + accels, no decels --Toco: occasional contraction --Cervix unchanged from previous: tight 2cm, long and very posterior, ballotable --Intact amniotic sac: negative fern, negative Amnisure --Reviewed interventions for acid reflux, including diet revision, time-based changes e.g. stay upright after meals, no eating within two hours of bedtime  Orders Placed This Encounter  Procedures   Wet prep, genital   Urinalysis, Routine w reflex microscopic Urine, Clean Catch   Protein / creatinine ratio, urine   CBC   Comprehensive metabolic panel   Lipase, blood   Amnisure rupture of membrane (rom)not at [facility]   Measure blood pressure   Insert peripheral IV   Patient Vitals for the past 24 hrs:  BP Temp Temp src Pulse Resp SpO2 Height Weight  12/20/21 1846 131/70 -- -- 87 -- 97 % -- --  12/20/21 1841 -- -- -- -- -- 98 % -- --  12/20/21 1836 -- -- -- -- -- 99 % -- --  12/20/21 1831 139/87 -- -- 94 -- 99 % -- --   12/20/21 1826 -- -- -- -- -- 99 % -- --  12/20/21 1821 -- -- -- -- -- 99 % -- --  12/20/21 1816 -- -- -- -- -- 99 % -- --  12/20/21 1811 -- -- -- -- -- 99 % -- --  12/20/21 1807 132/81 -- -- 90 -- -- -- --  12/20/21 1749 137/86 97.8 F (36.6 C) Oral 88 18 100 % 5\' 4"  (1.626 m) 96.4 kg  Results for orders placed or performed during the hospital encounter of 12/20/21 (from the past 24 hour(s))  CBC     Status: Abnormal   Collection Time: 12/20/21  6:09 PM  Result Value Ref Range   WBC 12.3 (H) 4.0 - 10.5 K/uL   RBC 4.23 3.87 - 5.11 MIL/uL   Hemoglobin 11.0 (L) 12.0 - 15.0 g/dL   HCT 66.5 (L) 99.3 - 57.0 %   MCV 80.9 80.0 - 100.0 fL   MCH 26.0 26.0 - 34.0 pg   MCHC 32.2 30.0 - 36.0 g/dL   RDW 17.7 93.9 - 03.0 %   Platelets 166 150 - 400 K/uL   nRBC 0.0 0.0 - 0.2 %  Comprehensive metabolic panel     Status: Abnormal   Collection Time: 12/20/21  6:09 PM  Result Value Ref Range   Sodium 140 135 - 145 mmol/L   Potassium 3.8 3.5 - 5.1 mmol/L   Chloride 103 98 - 111 mmol/L   CO2 24 22 - 32 mmol/L   Glucose, Bld 97 70 - 99 mg/dL   BUN 6 6 - 20 mg/dL   Creatinine, Ser 0.92 0.44 - 1.00 mg/dL   Calcium 9.4 8.9 - 33.0 mg/dL   Total Protein 7.0 6.5 - 8.1 g/dL   Albumin 2.8 (L) 3.5 - 5.0 g/dL   AST 21 15 - 41 U/L   ALT 14 0 - 44 U/L   Alkaline Phosphatase 116 38 - 126 U/L   Total Bilirubin 0.2 (L) 0.3 - 1.2 mg/dL   GFR, Estimated >07 >62 mL/min   Anion gap 13 5 - 15  Lipase, blood     Status: None   Collection Time: 12/20/21  6:09 PM  Result Value Ref Range   Lipase 27 11 - 51 U/L  Urinalysis, Routine w reflex microscopic Urine, Clean Catch     Status: Abnormal   Collection Time: 12/20/21  6:11 PM  Result Value Ref Range   Color, Urine YELLOW YELLOW   APPearance HAZY (A) CLEAR   Specific Gravity, Urine 1.024 1.005 - 1.030   pH 6.0 5.0 - 8.0   Glucose, UA NEGATIVE NEGATIVE mg/dL   Hgb urine dipstick NEGATIVE NEGATIVE   Bilirubin Urine NEGATIVE NEGATIVE   Ketones, ur NEGATIVE  NEGATIVE mg/dL   Protein, ur NEGATIVE NEGATIVE mg/dL   Nitrite NEGATIVE NEGATIVE   Leukocytes,Ua TRACE (A) NEGATIVE   RBC / HPF 0-5 0 - 5 RBC/hpf   WBC, UA 11-20 0 - 5 WBC/hpf   Bacteria, UA FEW (A) NONE SEEN   Squamous Epithelial / LPF 6-10 0 - 5   Mucus PRESENT   Protein / creatinine ratio, urine     Status: None   Collection Time: 12/20/21  6:11 PM  Result Value Ref Range   Creatinine, Urine 160 mg/dL   Total Protein, Urine 24 mg/dL   Protein Creatinine Ratio 0.15 0.00 - 0.15 mg/mg[Cre]  Amnisure rupture of membrane (rom)not at [facility]     Status: None   Collection Time: 12/20/21  7:13 PM  Result Value Ref Range   Amnisure ROM NEGATIVE   Wet prep, genital     Status: None   Collection Time: 12/20/21  7:43 PM   Specimen: Vaginal  Result Value Ref Range   Yeast Wet Prep HPF POC NONE SEEN NONE SEEN   Trich, Wet Prep NONE SEEN NONE SEEN   Clue Cells Wet Prep HPF POC NONE SEEN NONE SEEN   WBC, Wet Prep HPF POC <10 <  10   Sperm NONE SEEN    Meds ordered this encounter  Medications   lactated ringers infusion   alum & mag hydroxide-simeth (MAALOX/MYLANTA) 200-200-20 MG/5ML suspension 30 mL   famotidine (PEPCID) IVPB 20 mg premix   acetaminophen (TYLENOL) tablet 1,000 mg   Meds ordered this encounter  Medications   lactated ringers infusion   alum & mag hydroxide-simeth (MAALOX/MYLANTA) 200-200-20 MG/5ML suspension 30 mL   famotidine (PEPCID) IVPB 20 mg premix   acetaminophen (TYLENOL) tablet 1,000 mg   omeprazole (PRILOSEC OTC) 20 MG tablet    Sig: Take 1 tablet (20 mg total) by mouth daily.    Dispense:  30 tablet    Refill:  0    Order Specific Question:   Supervising Provider    Answer:   Jaynie Collins A [[id number]]   famotidine (PEPCID) 10 MG tablet    Sig: Take 1 tablet (10 mg total) by mouth 2 (two) times daily.    Dispense:  60 tablet    Refill:  0    Order Specific Question:   Supervising Provider    Answer:   Jaynie Collins A [[id number]]    Assessment and Plan  --21  y.o. G2P1001 at [redacted]w[redacted]d  --Reactive tracing --Cervix remains 2, very posterior --Intact amniotic sac --GERD: improved with treatments in MAU, new outpatient rx --BPs 130s/80s, PEC labs WNL --Normal physiologic changes in pregnancy --Discharge home in stable condition  F/U: Patient has OB appt 12/22/2021  Clayton Bibles, MSA, MSN, CNM Certified Nurse Midwife, Faculty Practice Center for [facility], [facility]

## 2021-12-22 ENCOUNTER — Telehealth (INDEPENDENT_AMBULATORY_CARE_PROVIDER_SITE_OTHER): Payer: Medicaid Other | Admitting: Family Medicine

## 2021-12-22 DIAGNOSIS — O1413 Severe pre-eclampsia, third trimester: Secondary | ICD-10-CM

## 2021-12-22 DIAGNOSIS — Z3480 Encounter for supervision of other normal pregnancy, unspecified trimester: Secondary | ICD-10-CM

## 2021-12-22 DIAGNOSIS — Z3A37 37 weeks gestation of pregnancy: Secondary | ICD-10-CM

## 2021-12-22 DIAGNOSIS — O09293 Supervision of pregnancy with other poor reproductive or obstetric history, third trimester: Secondary | ICD-10-CM

## 2021-12-22 DIAGNOSIS — Z8759 Personal history of other complications of pregnancy, childbirth and the puerperium: Secondary | ICD-10-CM

## 2021-12-22 NOTE — Progress Notes (Addendum)
   OBSTETRICS PRENATAL VIRTUAL VISIT ENCOUNTER NOTE  Provider location: Center for [facility] at [facility]   Patient location: Home  I connected with Army Fossa on 12/22/21 at  3:30 PM EDT by MyChart Video Encounter and verified that I am speaking with the correct person using two identifiers. I discussed the limitations, risks, security and privacy concerns of performing an evaluation and management service virtually and the availability of in person appointments. I also discussed with the patient that there may be a patient responsible charge related to this service. The patient expressed understanding and agreed to proceed. Subjective:  Kendra Rose is a 21 y.o. G2P1001 at [redacted]w[redacted]d being seen today for ongoing prenatal care.  She is currently monitored for the following issues for this high-risk pregnancy and has H/O severe pre-eclampsia; History of postpartum hemorrhage; Supervision of other normal pregnancy, antepartum; ASCUS with positive high risk HPV cervical; and Sinus tachycardia on their problem list.  Patient reports occasional contractions.   .  .   . Denies any leaking of fluid.   The following portions of the patient's history were reviewed and updated as appropriate: allergies, current medications, past family history, past medical history, past social history, past surgical history and problem list.   Objective:  There were no vitals filed for this visit.  Fetal Status:           General:  Alert, oriented and cooperative. Patient is in no acute distress.  Respiratory: Normal respiratory effort, no problems with respiration noted  Mental Status: Normal mood and affect. Normal behavior. Normal judgment and thought content.  Rest of physical exam deferred due to type of encounter  Imaging: No results found.  Assessment and Plan:  Pregnancy: G2P1001 at [redacted]w[redacted]d 1. Supervision of other normal pregnancy, antepartum Good fetal movement  2. [redacted] weeks  gestation of pregnancy   3. H/O severe pre-eclampsia BP normal - was evaluated in hospital a couple nights ago. Would like to be induced at 39 weeks to try to avoid preeclampsia.  4. History of postpartum hemorrhage Aggressive management of 3rd stage  Preterm labor symptoms and general obstetric precautions including but not limited to vaginal bleeding, contractions, leaking of fluid and fetal movement were reviewed in detail with the patient. I discussed the assessment and treatment plan with the patient. The patient was provided an opportunity to ask questions and all were answered. The patient agreed with the plan and demonstrated an understanding of the instructions. The patient was advised to call back or seek an in-person office evaluation/go to MAU at [facility] for any urgent or concerning symptoms. Please refer to After Visit Summary for other counseling recommendations.   I provided 8 minutes of face-to-face time during this encounter.  No follow-ups on file.  Future Appointments  Date Time Provider Department Center  12/29/2021 10:35 AM Anyanwu, Jethro Bastos, MD [facility] None  01/05/2022  3:30 PM Reva Bores, MD [facility] None    Levie Heritage, DO Center for [employer], [facility]

## 2021-12-29 ENCOUNTER — Encounter: Payer: Self-pay | Admitting: Obstetrics & Gynecology

## 2021-12-29 ENCOUNTER — Telehealth (INDEPENDENT_AMBULATORY_CARE_PROVIDER_SITE_OTHER): Payer: Medicaid Other | Admitting: Obstetrics & Gynecology

## 2021-12-29 VITALS — BP 130/80

## 2021-12-29 DIAGNOSIS — Z3A38 38 weeks gestation of pregnancy: Secondary | ICD-10-CM

## 2021-12-29 DIAGNOSIS — Z3480 Encounter for supervision of other normal pregnancy, unspecified trimester: Secondary | ICD-10-CM

## 2021-12-29 DIAGNOSIS — Z3483 Encounter for supervision of other normal pregnancy, third trimester: Secondary | ICD-10-CM

## 2021-12-29 NOTE — Patient Instructions (Signed)
Return to office for any scheduled appointments. Call the office or go to the [facility]U at [facility] at  if: You begin to have strong, frequent contractions Your water breaks.  Sometimes it is a big gush of fluid, sometimes it is just a trickle that keeps getting your underwear wet or running down your legs You have vaginal bleeding.  It is normal to have a small amount of spotting if your cervix was checked.  You do not feel your baby moving like normal.  If you do not, get something to eat and drink and lay down and focus on feeling your baby move.   If your baby is still not moving like normal, you should call the office or go to MAU. Any other obstetric concerns.  

## 2021-12-29 NOTE — Progress Notes (Signed)
   OBSTETRICS PRENATAL VIRTUAL VISIT ENCOUNTER NOTE  Provider location: Center for [facility] at [facility]   Patient location: Home  I connected with Kendra Rose on 12/29/21 at 10:35 AM EDT by MyChart Video Encounter and verified that I am speaking with the correct person using two identifiers. I discussed the limitations, risks, security and privacy concerns of performing an evaluation and management service virtually and the availability of in person appointments. I also discussed with the patient that there may be a patient responsible charge related to this service. The patient expressed understanding and agreed to proceed. Subjective:  Kendra Rose is a 21 y.o. G2P1001 at [redacted]w[redacted]d being seen today for ongoing prenatal care.  She is currently monitored for the following issues for this low-risk pregnancy and has H/O severe pre-eclampsia; History of postpartum hemorrhage; Supervision of other normal pregnancy, antepartum; ASCUS with positive high risk HPV cervical; and Sinus tachycardia on their problem list.  Patient reports no complaints.  Contractions: Irritability. Vag. Bleeding: None.  Movement: Present. Denies any leaking of fluid.   The following portions of the patient's history were reviewed and updated as appropriate: allergies, current medications, past family history, past medical history, past social history, past surgical history and problem list.   Objective:   Vitals:   12/29/21 1021  BP: 130/80    Fetal Status:     Movement: Present     General:  Alert, oriented and cooperative. Patient is in no acute distress.  Respiratory: Normal respiratory effort, no problems with respiration noted  Mental Status: Normal mood and affect. Normal behavior. Normal judgment and thought content.  Rest of physical exam deferred due to type of encounter  Imaging: No results found.  Assessment and Plan:  Pregnancy: G2P1001 at [redacted]w[redacted]d 1. [redacted] weeks gestation of  pregnancy 2. Supervision of other normal pregnancy, antepartum Already scheduled for elective IOL at 39 weeks, all questions answered.  Labor symptoms and general obstetric precautions including but not limited to vaginal bleeding, contractions, leaking of fluid and fetal movement were reviewed in detail with the patient. I discussed the assessment and treatment plan with the patient. The patient was provided an opportunity to ask questions and all were answered. The patient agreed with the plan and demonstrated an understanding of the instructions. The patient was advised to call back or seek an in-person office evaluation/go to MAU at [facility] for any urgent or concerning symptoms. Please refer to After Visit Summary for other counseling recommendations.   I provided 5 minutes of face-to-face time during this encounter.  Return for Postpartum check.  Future Appointments  Date Time Provider Department Center  01/06/2022  6:45 AM [facility] SCHED ROOM [facility] None    Jaynie Collins, MD Center for [facility], [facility]

## 2022-01-04 ENCOUNTER — Other Ambulatory Visit: Payer: Self-pay | Admitting: Advanced Practice Midwife

## 2022-01-04 ENCOUNTER — Telehealth (HOSPITAL_COMMUNITY): Payer: Self-pay | Admitting: *Deleted

## 2022-01-04 ENCOUNTER — Encounter (HOSPITAL_COMMUNITY): Payer: Self-pay | Admitting: *Deleted

## 2022-01-04 NOTE — Telephone Encounter (Signed)
Preadmission screen  

## 2022-01-05 ENCOUNTER — Encounter: Payer: Medicaid Other | Admitting: Family Medicine

## 2022-01-06 ENCOUNTER — Inpatient Hospital Stay (HOSPITAL_COMMUNITY): Payer: Medicaid Other

## 2022-01-06 ENCOUNTER — Encounter (HOSPITAL_COMMUNITY): Payer: Self-pay | Admitting: Family Medicine

## 2022-01-06 ENCOUNTER — Inpatient Hospital Stay (HOSPITAL_COMMUNITY): Payer: Medicaid Other | Admitting: Anesthesiology

## 2022-01-06 ENCOUNTER — Inpatient Hospital Stay (HOSPITAL_COMMUNITY)
Admission: AD | Admit: 2022-01-06 | Discharge: 2022-01-08 | DRG: 768 | Disposition: A | Discharge status: Home / Self Care | Payer: Medicaid Other | Attending: Obstetrics and Gynecology | Admitting: Obstetrics and Gynecology

## 2022-01-06 ENCOUNTER — Other Ambulatory Visit: Payer: Self-pay

## 2022-01-06 DIAGNOSIS — Z3A39 39 weeks gestation of pregnancy: Secondary | ICD-10-CM

## 2022-01-06 DIAGNOSIS — R000 Tachycardia, unspecified: Secondary | ICD-10-CM | POA: Diagnosis not present

## 2022-01-06 DIAGNOSIS — D649 Anemia, unspecified: Secondary | ICD-10-CM | POA: Diagnosis not present

## 2022-01-06 DIAGNOSIS — O99892 Other specified diseases and conditions complicating childbirth: Secondary | ICD-10-CM | POA: Diagnosis present

## 2022-01-06 DIAGNOSIS — Z3490 Encounter for supervision of normal pregnancy, unspecified, unspecified trimester: Principal | ICD-10-CM | POA: Diagnosis present

## 2022-01-06 DIAGNOSIS — O134 Gestational [pregnancy-induced] hypertension without significant proteinuria, complicating childbirth: Principal | ICD-10-CM | POA: Diagnosis present

## 2022-01-06 DIAGNOSIS — O9081 Anemia of the puerperium: Secondary | ICD-10-CM | POA: Diagnosis not present

## 2022-01-06 DIAGNOSIS — D62 Acute posthemorrhagic anemia: Secondary | ICD-10-CM | POA: Diagnosis not present

## 2022-01-06 DIAGNOSIS — O139 Gestational [pregnancy-induced] hypertension without significant proteinuria, unspecified trimester: Secondary | ICD-10-CM | POA: Diagnosis not present

## 2022-01-06 DIAGNOSIS — Z87891 Personal history of nicotine dependence: Secondary | ICD-10-CM

## 2022-01-06 DIAGNOSIS — O26893 Other specified pregnancy related conditions, third trimester: Secondary | ICD-10-CM | POA: Diagnosis not present

## 2022-01-06 DIAGNOSIS — O9902 Anemia complicating childbirth: Secondary | ICD-10-CM | POA: Diagnosis not present

## 2022-01-06 DIAGNOSIS — Z8759 Personal history of other complications of pregnancy, childbirth and the puerperium: Secondary | ICD-10-CM

## 2022-01-06 HISTORY — DX: Tachycardia, unspecified: R00.0

## 2022-01-06 LAB — COMPREHENSIVE METABOLIC PANEL
ALT: 16 U/L (ref 0–44)
AST: 26 U/L (ref 15–41)
Albumin: 2.8 g/dL — ABNORMAL LOW (ref 3.5–5.0)
Alkaline Phosphatase: 130 U/L — ABNORMAL HIGH (ref 38–126)
Anion gap: 11 (ref 5–15)
BUN: 7 mg/dL (ref 6–20)
CO2: 21 mmol/L — ABNORMAL LOW (ref 22–32)
Calcium: 9.1 mg/dL (ref 8.9–10.3)
Chloride: 105 mmol/L (ref 98–111)
Creatinine, Ser: 0.46 mg/dL (ref 0.44–1.00)
GFR, Estimated: >60 mL/min (ref >60)
Glucose, Bld: 84 mg/dL (ref 70–99)
Potassium: 3.6 mmol/L (ref 3.5–5.1)
Sodium: 137 mmol/L (ref 135–145)
Total Bilirubin: 0.5 mg/dL (ref 0.3–1.2)
Total Protein: 7 g/dL (ref 6.5–8.1)

## 2022-01-06 LAB — CBC
HCT: 33.7 % — ABNORMAL LOW (ref 36.0–46.0)
Hemoglobin: 11.1 g/dL — ABNORMAL LOW (ref 12.0–15.0)
MCH: 26.1 pg (ref 26.0–34.0)
MCHC: 32.9 g/dL (ref 30.0–36.0)
MCV: 79.1 fL — ABNORMAL LOW (ref 80.0–100.0)
Platelets: 174 10*3/uL (ref 150–400)
RBC: 4.26 MIL/uL (ref 3.87–5.11)
RDW: 15.2 % (ref 11.5–15.5)
WBC: 10.6 10*3/uL — ABNORMAL HIGH (ref 4.0–10.5)
nRBC: 0 % (ref 0.0–0.2)

## 2022-01-06 LAB — RPR: RPR Ser Ql: NONREACTIVE

## 2022-01-06 LAB — PROTEIN / CREATININE RATIO, URINE
Creatinine, Urine: 65 mg/dL
Protein Creatinine Ratio: 0.18 mg/mg{Cre} — ABNORMAL HIGH (ref 0.00–0.15)
Total Protein, Urine: 12 mg/dL

## 2022-01-06 LAB — TYPE AND SCREEN
ABO/RH(D): O POS
Antibody Screen: NEGATIVE

## 2022-01-06 MED ORDER — LIDOCAINE HCL (PF) 1 % IJ SOLN
INTRAMUSCULAR | Status: DC | PRN
  Administered 2022-01-06 (×2): 5 mL via EPIDURAL

## 2022-01-06 MED ORDER — CARBOPROST TROMETHAMINE 250 MCG/ML IM SOLN
INTRAMUSCULAR | Status: AC
  Administered 2022-01-06: 250 ug via INTRAMUSCULAR
  Filled 2022-01-06: qty 1

## 2022-01-06 MED ORDER — LACTATED RINGERS IV SOLN: 500.0000 mL | INTRAVENOUS | Status: DC | PRN

## 2022-01-06 MED ORDER — BUPIVACAINE HCL (PF) 0.25 % IJ SOLN
INTRAMUSCULAR | Status: DC | PRN
  Administered 2022-01-06 (×2): 3 mL via EPIDURAL

## 2022-01-06 MED ORDER — TERBUTALINE SULFATE 1 MG/ML IJ SOLN: 0.2500 mg | Freq: Once | INTRAMUSCULAR | Status: DC | PRN

## 2022-01-06 MED ORDER — OXYTOCIN-SODIUM CHLORIDE 30-0.9 UT/500ML-% IV SOLN
1.0000 m[IU]/min | INTRAVENOUS | Status: DC
  Administered 2022-01-06: 2 m[IU]/min via INTRAVENOUS
  Filled 2022-01-06: qty 500

## 2022-01-06 MED ORDER — ACETAMINOPHEN 325 MG PO TABS: 650.0000 mg | ORAL_TABLET | ORAL | Status: DC | PRN

## 2022-01-06 MED ORDER — ONDANSETRON HCL 4 MG/2ML IJ SOLN
4.0000 mg | Freq: Four times a day (QID) | INTRAMUSCULAR | Status: DC | PRN
  Administered 2022-01-07: 4 mg via INTRAVENOUS
  Filled 2022-01-06: qty 2

## 2022-01-06 MED ORDER — MISOPROSTOL 200 MCG PO TABS
400.0000 ug | ORAL_TABLET | ORAL | Status: AC
Start: 2022-01-07 — End: 2022-01-06
  Administered 2022-01-06: 400 ug via RECTAL

## 2022-01-06 MED ORDER — METHYLERGONOVINE MALEATE 0.2 MG/ML IJ SOLN
INTRAMUSCULAR | Status: AC
  Administered 2022-01-06: 0.2 mg via INTRAMUSCULAR
  Filled 2022-01-06: qty 1

## 2022-01-06 MED ORDER — OXYTOCIN BOLUS FROM INFUSION
333.0000 mL | Freq: Once | INTRAVENOUS | Status: AC
  Administered 2022-01-06: 333 mL via INTRAVENOUS

## 2022-01-06 MED ORDER — TRANEXAMIC ACID-NACL 1000-0.7 MG/100ML-% IV SOLN: 1000.0000 mg | Freq: Once | INTRAVENOUS | Status: DC | PRN

## 2022-01-06 MED ORDER — EPHEDRINE 5 MG/ML INJ: 10.0000 mg | INTRAVENOUS | Status: DC | PRN

## 2022-01-06 MED ORDER — TRANEXAMIC ACID-NACL 1000-0.7 MG/100ML-% IV SOLN
1000.0000 mg | INTRAVENOUS | Status: AC
  Administered 2022-01-06: 1000 mg via INTRAVENOUS
  Filled 2022-01-06: qty 100

## 2022-01-06 MED ORDER — MISOPROSTOL 200 MCG PO TABS
ORAL_TABLET | ORAL | Status: AC
  Administered 2022-01-06: 400 ug via BUCCAL
  Filled 2022-01-06: qty 1

## 2022-01-06 MED ORDER — METHYLERGONOVINE MALEATE 0.2 MG/ML IJ SOLN: 0.2000 mg | INTRAMUSCULAR | Status: AC

## 2022-01-06 MED ORDER — OXYCODONE-ACETAMINOPHEN 5-325 MG PO TABS: 1.0000 | ORAL_TABLET | ORAL | Status: DC | PRN

## 2022-01-06 MED ORDER — LIDOCAINE HCL (PF) 1 % IJ SOLN: 30.0000 mL | INTRAMUSCULAR | Status: DC | PRN

## 2022-01-06 MED ORDER — PHENYLEPHRINE 80 MCG/ML (10ML) SYRINGE FOR IV PUSH (FOR BLOOD PRESSURE SUPPORT)
80.0000 ug | PREFILLED_SYRINGE | INTRAVENOUS | Status: DC | PRN
  Administered 2022-01-06: 80 ug via INTRAVENOUS

## 2022-01-06 MED ORDER — MISOPROSTOL 200 MCG PO TABS
400.0000 ug | ORAL_TABLET | ORAL | Status: AC
Start: 2022-01-07 — End: 2022-01-06

## 2022-01-06 MED ORDER — SOD CITRATE-CITRIC ACID 500-334 MG/5ML PO SOLN: 30.0000 mL | ORAL | Status: DC | PRN

## 2022-01-06 MED ORDER — OXYTOCIN-SODIUM CHLORIDE 30-0.9 UT/500ML-% IV SOLN
2.5000 [IU]/h | INTRAVENOUS | Status: DC
  Administered 2022-01-07 (×2): 2.5 [IU]/h via INTRAVENOUS
  Filled 2022-01-06: qty 500

## 2022-01-06 MED ORDER — LACTATED RINGERS IV SOLN: INTRAVENOUS | Status: DC

## 2022-01-06 MED ORDER — CARBOPROST TROMETHAMINE 250 MCG/ML IM SOLN
250.0000 ug | Freq: Once | INTRAMUSCULAR | Status: AC
Start: 2022-01-07 — End: 2022-01-06

## 2022-01-06 MED ORDER — FENTANYL-BUPIVACAINE-NACL 0.5-0.125-0.9 MG/250ML-% EP SOLN
12.0000 mL/h | EPIDURAL | Status: DC | PRN
  Administered 2022-01-06: 12 mL/h via EPIDURAL
  Filled 2022-01-06: qty 250

## 2022-01-06 MED ORDER — OXYCODONE-ACETAMINOPHEN 5-325 MG PO TABS: 2.0000 | ORAL_TABLET | ORAL | Status: DC | PRN

## 2022-01-06 MED ORDER — LACTATED RINGERS IV SOLN: 500.0000 mL | Freq: Once | INTRAVENOUS | Status: DC

## 2022-01-06 MED ORDER — LEVONORGESTREL 20 MCG/DAY IU IUD
1.0000 | INTRAUTERINE_SYSTEM | Freq: Once | INTRAUTERINE | Status: DC
  Filled 2022-01-06: qty 1

## 2022-01-06 MED ORDER — PHENYLEPHRINE 80 MCG/ML (10ML) SYRINGE FOR IV PUSH (FOR BLOOD PRESSURE SUPPORT)
80.0000 ug | PREFILLED_SYRINGE | INTRAVENOUS | Status: DC | PRN
  Filled 2022-01-06: qty 10

## 2022-01-06 MED ORDER — DIPHENOXYLATE-ATROPINE 2.5-0.025 MG PO TABS
2.0000 | ORAL_TABLET | Freq: Once | ORAL | Status: AC
Start: 2022-01-07 — End: 2022-01-07
  Administered 2022-01-07: 2 via ORAL
  Filled 2022-01-06: qty 2

## 2022-01-06 MED ORDER — DIPHENHYDRAMINE HCL 50 MG/ML IJ SOLN: 12.5000 mg | INTRAMUSCULAR | Status: DC | PRN

## 2022-01-06 MED ORDER — LIDOCAINE-EPINEPHRINE (PF) 2 %-1:200000 IJ SOLN
INTRAMUSCULAR | Status: DC | PRN
  Administered 2022-01-06: 3 mL via EPIDURAL

## 2022-01-06 NOTE — Progress Notes (Signed)
Kendra Rose is a 21 y.o. G2P1001 at [redacted]w[redacted]d   Subjective: S/p epidural placement and comfortable, partner at bedside and engaged in care  Objective: BP (!) 141/91   Pulse 89   Temp 98.5 F (36.9 C) (Oral)   Resp 18   Ht 5\' 4"  (1.626 m)   Wt 96.8 kg   LMP 03/21/2021   SpO2 99%   BMI 36.65 kg/m  No intake/output data recorded. No intake/output data recorded.  FHT:  FHR: 130 bpm, variability: moderate,  accelerations:  Present,  decelerations:  Absent UC:   regular, every 2-3 minutes SVE:   Dilation: 3.5 Effacement (%): 60 Station: -2 Exam by:: J.Cox, RN  Labs: Lab Results  Component Value Date   WBC 10.6 (H) 01/06/2022   HGB 11.1 (L) 01/06/2022   HCT 33.7 (L) 01/06/2022   MCV 79.1 (L) 01/06/2022   PLT 174 01/06/2022    Assessment / Plan: --21 y.o. G2P1001 at [redacted]w[redacted]d admitted for eIOL due to hx of Severe Pre and PPH --Cat I tracing --GHTN, PEC labs ordered, P:Cr collected --GBS neg --Pitocin initiated at 0830, currently at 10 milliunits --CNM at bedside to consent for pp IUD --Hx PPH d/t atony, plan for TXA at delivery, low threshold for Jada, Hemabate and Lomotil --Contiune Pitocin titration PRN, assess for AROM in advance labor --Anticipate vaginal birth  [redacted]w[redacted]d, Calvert Cantor 01/06/2022, 11:45 AM

## 2022-01-06 NOTE — Progress Notes (Signed)
Patient now meets criteria for GHTN. Stat CMET and P:Cr ordered. RN notified.  Patient Vitals for the past 24 hrs:  BP Temp Temp src Pulse Resp SpO2 Height Weight  01/06/22 1116 129/74 -- -- 76 -- -- -- --  01/06/22 1115 -- -- -- -- -- 99 % -- --  01/06/22 1111 134/82 -- -- 85 -- -- -- --  01/06/22 1110 -- -- -- -- -- 99 % -- --  01/06/22 1106 (!) 131/98 -- -- 85 -- -- -- --  01/06/22 1105 -- -- -- -- -- 99 % -- --  01/06/22 1101 132/76 -- -- 76 -- -- -- --  01/06/22 1100 -- -- -- -- -- 99 % -- --  01/06/22 1056 (!) 151/76 -- -- 72 -- -- -- --  01/06/22 1055 -- -- -- -- -- 100 % -- --  01/06/22 1052 132/66 -- -- 71 -- -- -- --  01/06/22 1050 -- -- -- -- -- 100 % -- --  01/06/22 1045 -- -- -- -- -- 100 % -- --  01/06/22 1029 134/71 -- -- 80 -- -- -- --  01/06/22 0959 120/81 -- -- 81 -- -- -- --  01/06/22 0929 131/79 -- -- 76 -- -- -- --  01/06/22 0712 130/80 -- -- 88 -- -- -- --  01/06/22 0711 -- -- -- -- -- -- 5\' 4"  (1.626 m) 96.8 kg  01/06/22 0652 (!) 145/100 98.5 F (36.9 C) Oral 89 18 -- -- --    01/08/22, MSA, MSN, CNM Certified Nurse Midwife, [place] Bibles for Biochemist, clinical, [facility]

## 2022-01-06 NOTE — Progress Notes (Signed)
Labor Progress Note Kendra Rose is a 21 y.o. G2P1001 at [redacted]w[redacted]d presented for eIOL S: Patient is resting comfortably with epidural.  O:  BP 124/81   Pulse 80   Temp 98.1 F (36.7 C) (Oral)   Resp 18   Ht 5\' 4"  (1.626 m)   Wt 96.8 kg   LMP 03/21/2021   SpO2 99%   BMI 36.65 kg/m  EFM: 140/+ acels/no decels  CVE: Dilation: (P) 4.5 Effacement (%): (P) 60 Station: (P) -1 Presentation: (P) Vertex Exam by:: (P) Weinhold, CNM   A&P: 21 y.o. G2P1001 [redacted]w[redacted]d eIOL #Labor: Progressing well. Cont Pitocin, AROM at 1730. Discussed IUPC and amnioinfusion if FWB decreases. Patient amendable.  #Pain: epidural #FWB: cat 1 #GBS negative   [redacted]w[redacted]d, DO Center for [place] [facility], [facility] 5:32 PM

## 2022-01-06 NOTE — Anesthesia Preprocedure Evaluation (Signed)
Anesthesia Evaluation  Patient identified by MRN, date of birth, ID band Patient awake    Reviewed: Allergy & Precautions, Patient's Chart, lab work & pertinent test results  History of Anesthesia Complications Negative for: history of anesthetic complications  Airway Mallampati: II  TM Distance: >3 FB Neck ROM: Full    Dental  (+) Teeth Intact, Dental Advisory Given   Pulmonary neg pulmonary ROS, former smoker,    breath sounds clear to auscultation       Cardiovascular negative cardio ROS   Rhythm:Regular     Neuro/Psych negative neurological ROS  negative psych ROS   GI/Hepatic negative GI ROS, Neg liver ROS,   Endo/Other  negative endocrine ROS  Renal/GU negative Renal ROS     Musculoskeletal   Abdominal   Peds  Hematology  (+) Blood dyscrasia, anemia , Lab Results      Component                Value               Date                      WBC                      10.6 (H)            01/06/2022                HGB                      11.1 (L)            01/06/2022                HCT                      33.7 (L)            01/06/2022                MCV                      79.1 (L)            01/06/2022                PLT                      174                 01/06/2022              Anesthesia Other Findings   Reproductive/Obstetrics (+) Pregnancy                             Anesthesia Physical Anesthesia Plan  ASA: 2  Anesthesia Plan: Epidural   Post-op Pain Management:    Induction:   PONV Risk Score and Plan: 2 and Treatment may vary due to age or medical condition  Airway Management Planned: Natural Airway  Additional Equipment: None  Intra-op Plan:   Post-operative Plan:   Informed Consent: I have reviewed the patients History and Physical, chart, labs and discussed the procedure including the risks, benefits and alternatives for the proposed anesthesia with  the patient or authorized representative who has indicated his/her understanding and acceptance.  Plan Discussed with:   Anesthesia Plan Comments:         Anesthesia Quick Evaluation

## 2022-01-06 NOTE — H&P (Cosign Needed Addendum)
OBSTETRIC ADMISSION HISTORY AND PHYSICAL  Kendra Rose is a 21 y.o. female G2P1001 with IUP at [redacted]w[redacted]d by LMP confirmed with U/S presenting for IOL. She reports +FMs, No LOF, no VB, no blurry vision, headaches or peripheral edema, and RUQ pain.  She plans on bottle feeding. She requests post-placental IUD for birth control. She received her prenatal care at  [facility]    Dating: By LMP --->  Estimated Date of Delivery: 01/12/22  Sono:    @[redacted]w[redacted]d , CWD, normal anatomy, variable presentation,  364g, 46% EFW   Prenatal History/Complications: PMHx of Pre-E, PPH  Past Medical History: Past Medical History:  Diagnosis Date   Eczema    History of pre-eclampsia    Pregnancy induced hypertension    Tachycardia     Past Surgical History: History reviewed. No pertinent surgical history.  Obstetrical History: OB History     Gravida  2   Para  1   Term  1   Preterm      AB      Living  1      SAB      IAB      Ectopic      Multiple  0   Live Births  1           Social History Social History   Socioeconomic History   Marital status: Significant Other    Spouse name:   Number of children: Not on file   Years of education: Not on file   Highest education level: Not on file  Occupational History   Not on file  Tobacco Use   Smoking status: Former    Packs/day: 0.25    Years: 3.00    Total pack years: 0.75    Types: Cigarettes    Quit date: 02/11/2019    Years since quitting: 2.9   Smokeless tobacco: Never  Vaping Use   Vaping Use: Never used  Substance and Sexual Activity   Alcohol use: Not Currently   Drug use: Never   Sexual activity: Yes    Birth control/protection: None  Other Topics Concern   Not on file  Social History Narrative   Not on file   Social Determinants of Health   Financial Resource Strain: Not on file  Food Insecurity: Not on file  Transportation Needs: Not on file  Physical Activity: Not on file  Stress: Not on file   Social Connections: Not on file    Family History: Family History  Problem Relation Age of Onset   Diabetes Father     Allergies: No Known Allergies  Pt denies allergies to latex, iodine, or shellfish.  Medications Prior to Admission  Medication Sig Dispense Refill Last Dose   aspirin EC 81 MG tablet Take 1 tablet (81 mg total) by mouth daily. Take after 12 weeks for prevention of preeclampsia later in pregnancy 300 tablet 2    famotidine (PEPCID) 10 MG tablet Take 1 tablet (10 mg total) by mouth 2 (two) times daily. (Patient not taking: Reported on 12/29/2021) 60 tablet 0    omeprazole (PRILOSEC OTC) 20 MG tablet Take 1 tablet (20 mg total) by mouth daily. (Patient not taking: Reported on 12/29/2021) 30 tablet 0    Prenatal Vit-Fe Fumarate-FA (PRENATAL MULTIVITAMIN) TABS tablet Take 1 tablet by mouth daily at 12 noon.        Review of Systems   All systems reviewed and negative except as stated in HPI  Blood pressure 130/80, pulse  88, temperature 98.5 F (36.9 C), temperature source Oral, resp. rate 18, height 5\' 4"  (1.626 m), weight 96.8 kg, last menstrual period 03/21/2021. General appearance: alert and cooperative Lungs: clear to auscultation bilaterally Heart: regular rate and rhythm Abdomen: soft, non-tender; bowel sounds normal Extremities: Homans sign is negative, no sign of DVT Presentation: cephalic Fetal monitoringBaseline: 130 bpm, Variability: Good {> 6 bpm), Accelerations: Reactive, and Decelerations: Absent Uterine activityNone     Prenatal labs: ABO, Rh: --/--/O POS (07/28 0701) Antibody: NEG (07/28 0701) Rubella: 1.46 (01/05 1137) RPR: Non Reactive (05/04 0914)  HBsAg: Negative (01/05 1137)  HIV: Non Reactive (05/04 0902)  GBS: Negative/-- (07/06 1605)  1 hr Glucola: normal Genetic screening:  normal  Anatomy [birth year]1993: normal   Prenatal Transfer Tool  Maternal Diabetes: No Genetic Screening: Normal Maternal Ultrasounds/Referrals: Normal Fetal  Ultrasounds or other Referrals:  None Maternal Substance Abuse:  No Significant Maternal Medications:  None Significant Maternal Lab Results: Group B Strep negative  Results for orders placed or performed during the hospital encounter of 01/06/22 (from the past 24 hour(s))  CBC   Collection Time: 01/06/22  7:01 AM  Result Value Ref Range   WBC 10.6 (H) 4.0 - 10.5 K/uL   RBC 4.26 3.87 - 5.11 MIL/uL   Hemoglobin 11.1 (L) 12.0 - 15.0 g/dL   HCT 01/08/22 (L) 13.1 - 43.8 %   MCV 79.1 (L) 80.0 - 100.0 fL   MCH 26.1 26.0 - 34.0 pg   MCHC 32.9 30.0 - 36.0 g/dL   RDW 88.7 57.9 - 72.8 %   Platelets 174 150 - 400 K/uL   nRBC 0.0 0.0 - 0.2 %  Type and screen   Collection Time: 01/06/22  7:01 AM  Result Value Ref Range   ABO/RH(D) O POS    Antibody Screen NEG    Sample Expiration      01/09/2022,2359 Performed at [facility], [address] [place], [place]     Patient Active Problem List   Diagnosis Date Noted   Encounter for induction of labor 01/06/2022   Sinus tachycardia 10/13/2021   ASCUS with positive high risk HPV cervical 07/14/2021   Supervision of other normal pregnancy, antepartum 06/16/2021   History of postpartum hemorrhage 10/10/2019   H/O severe pre-eclampsia 08/19/2019    Assessment/Plan:  Kendra Rose is a 21 y.o. G2P1001 at [redacted]w[redacted]d here for IOL.   #Labor: IOL, start with cytotec if cervical check ~1.5, or FB if >2 #Pain: Planning epidural #FWB: Cat 1 #ID:  GBS neg #MOF: bottle #MOC: post-placental IUD #Circ:  Yes, outpatient   [redacted]w[redacted]d, CNM  [facility], [facility] 01/06/2022, 8:15 AM

## 2022-01-06 NOTE — Progress Notes (Signed)
Patient ID: Kendra Rose, female   DOB: Aug 27, 2000, 21 y.o.   MRN: [id number]  Has been on Pitocin all day with AROM at 1730; recently had epidural replaced and it is working better now  BPs 115/80, 125/65 FHR 115-120s, +accels, no decels Ctx q 2-3 mins with Pit at 14mu/min Cx 6/80/vtx -2 @ 2010 per RN exam  IUP@39 .1wks New onset gHTN- neg labs Active labor  -Check cx in ~2hrs or sooner prn pressure -Anticipate vag del  [name] CNM 01/06/2022

## 2022-01-06 NOTE — Progress Notes (Signed)
[name] is a 21 y.o. G2P1001 at [redacted]w[redacted]d   Subjective: Epiduralized, reports new onset recurrent perineal pressure. Sitting in High Fowler's   Objective: BP 132/79   Pulse 80   Temp 98.1 F (36.7 C) (Oral)   Resp 18   Ht 5\' 4"  (1.626 m)   Wt 96.8 kg   LMP 03/21/2021   SpO2 99%   BMI 36.65 kg/m  No intake/output data recorded. No intake/output data recorded.  FHT:  FHR: 135 bpm, variability: moderate,  accelerations:  Present,  decelerations:  Absent UC:   regular, every 2-3 minutes SVE:   Dilation: 3.5 Effacement (%): 60 Station: -2 Exam by:: [email or web address], CNM  Labs: Lab Results  Component Value Date   WBC 10.6 (H) 01/06/2022   HGB 11.1 (L) 01/06/2022   HCT 33.7 (L) 01/06/2022   MCV 79.1 (L) 01/06/2022   PLT 174 01/06/2022    Assessment / Plan: --21 y.o. G2P1001 at [redacted]w[redacted]d --Cat I tracing --Pitocin initiated at 0830 --Cervix unchanged from previous per my exam --Remains -2 station, fetal head not well applied at maternal left or posterior, not a candidate for AROM --Continue Pitocin titration, reassess in 4 hours --Anticipate vaginal birth  [redacted]w[redacted]d, Kendra Rose 01/06/2022, 2:20 PM

## 2022-01-06 NOTE — Anesthesia Procedure Notes (Signed)
Epidural Patient location during procedure: OB Start time: 01/06/2022 10:45 AM End time: 01/06/2022 11:00 AM  Staffing Anesthesiologist: Val Eagle, MD Performed: anesthesiologist   Preanesthetic Checklist Completed: patient identified, IV checked, risks and benefits discussed, monitors and equipment checked, pre-op evaluation and timeout performed  Epidural Patient position: sitting Prep: DuraPrep Patient monitoring: heart rate, continuous pulse ox and blood pressure Approach: midline Location: L3-L4 Injection technique: LOR saline  Needle:  Needle type: Tuohy  Needle gauge: 17 G Needle length: 9 cm Needle insertion depth: 9 cm Catheter type: closed end flexible Catheter size: 19 Gauge Catheter at skin depth: 15 cm Test dose: negative and 2% lidocaine with Epi 1:200 K  Assessment Events: blood not aspirated, injection not painful, no injection resistance, no paresthesia and negative IV test

## 2022-01-06 NOTE — Anesthesia Procedure Notes (Addendum)
Epidural Patient location during procedure: OB Start time: 01/06/2022 7:13 PM End time: 01/06/2022 7:20 PM  Staffing Anesthesiologist: Mal Amabile, MD Performed: anesthesiologist   Preanesthetic Checklist Completed: patient identified, IV checked, site marked, risks and benefits discussed, surgical consent, monitors and equipment checked, pre-op evaluation and timeout performed  Epidural Patient position: sitting Prep: DuraPrep and site prepped and draped Patient monitoring: continuous pulse ox and blood pressure Approach: midline Location: L3-L4 Injection technique: LOR air  Needle:  Needle type: Tuohy  Needle gauge: 17 G Needle length: 9 cm and 9 Needle insertion depth: 6 cm Catheter type: closed end flexible Catheter size: 19 Gauge Catheter at skin depth: 11 cm Test dose: negative and Other  Assessment Events: blood not aspirated, injection not painful, no injection resistance, no paresthesia and negative IV test  Additional Notes Patient identified. Risks and benefits discussed including failed block, incomplete  Pain control, post dural puncture headache, nerve damage, paralysis, blood pressure Changes, nausea, vomiting, reactions to medications-both toxic and allergic and post Partum back pain. All questions were answered. Patient expressed understanding and wished to proceed. Sterile technique was used throughout procedure. Epidural site was Dressed with sterile barrier dressing. No paresthesias, signs of intravascular injection Or signs of intrathecal spread were encountered.  Patient was more comfortable after the epidural was dosed. Please see RN's note for documentation of vital signs and FHR which are stable. Reason for block:procedure for pain

## 2022-01-06 NOTE — Plan of Care (Signed)

## 2022-01-07 DIAGNOSIS — D62 Acute posthemorrhagic anemia: Secondary | ICD-10-CM | POA: Diagnosis not present

## 2022-01-07 LAB — CBC
HCT: 25.5 % — ABNORMAL LOW (ref 36.0–46.0)
Hemoglobin: 8.3 g/dL — ABNORMAL LOW (ref 12.0–15.0)
MCH: 25.7 pg — ABNORMAL LOW (ref 26.0–34.0)
MCHC: 32.5 g/dL (ref 30.0–36.0)
MCV: 78.9 fL — ABNORMAL LOW (ref 80.0–100.0)
Platelets: 131 10*3/uL — ABNORMAL LOW (ref 150–400)
RBC: 3.23 MIL/uL — ABNORMAL LOW (ref 3.87–5.11)
RDW: 15.2 % (ref 11.5–15.5)
WBC: 14.7 10*3/uL — ABNORMAL HIGH (ref 4.0–10.5)
nRBC: 0 % (ref 0.0–0.2)

## 2022-01-07 MED ORDER — FENTANYL CITRATE (PF) 100 MCG/2ML IJ SOLN
INTRAMUSCULAR | Status: AC
  Administered 2022-01-07: 50 ug via INTRAVENOUS
  Filled 2022-01-07: qty 2

## 2022-01-07 MED ORDER — BENZOCAINE-MENTHOL 20-0.5 % EX AERO: 1.0000 | INHALATION_SPRAY | CUTANEOUS | Status: DC | PRN

## 2022-01-07 MED ORDER — TETANUS-DIPHTH-ACELL PERTUSSIS 5-2.5-18.5 LF-MCG/0.5 IM SUSY: 0.5000 mL | PREFILLED_SYRINGE | Freq: Once | INTRAMUSCULAR | Status: DC

## 2022-01-07 MED ORDER — ACETAMINOPHEN 325 MG PO TABS
650.0000 mg | ORAL_TABLET | ORAL | Status: DC | PRN
  Administered 2022-01-07 (×2): 650 mg via ORAL
  Filled 2022-01-07 (×2): qty 2

## 2022-01-07 MED ORDER — ONDANSETRON HCL 4 MG/2ML IJ SOLN: 4.0000 mg | INTRAMUSCULAR | Status: DC | PRN

## 2022-01-07 MED ORDER — WITCH HAZEL-GLYCERIN EX PADS: 1.0000 | MEDICATED_PAD | CUTANEOUS | Status: DC | PRN

## 2022-01-07 MED ORDER — OXYCODONE HCL 5 MG PO TABS
5.0000 mg | ORAL_TABLET | ORAL | Status: DC | PRN
  Administered 2022-01-07 (×3): 5 mg via ORAL
  Filled 2022-01-07 (×3): qty 1

## 2022-01-07 MED ORDER — SIMETHICONE 80 MG PO CHEW: 80.0000 mg | CHEWABLE_TABLET | ORAL | Status: DC | PRN

## 2022-01-07 MED ORDER — METHYLERGONOVINE MALEATE 0.2 MG PO TABS
0.2000 mg | ORAL_TABLET | Freq: Four times a day (QID) | ORAL | Status: DC
  Administered 2022-01-07 (×3): 0.2 mg via ORAL
  Filled 2022-01-07 (×3): qty 1

## 2022-01-07 MED ORDER — SODIUM CHLORIDE 0.9 % IV SOLN
500.0000 mg | Freq: Once | INTRAVENOUS | Status: AC
  Administered 2022-01-07: 500 mg via INTRAVENOUS
  Filled 2022-01-07: qty 25
  Filled 2022-01-07: qty 500

## 2022-01-07 MED ORDER — IBUPROFEN 600 MG PO TABS
600.0000 mg | ORAL_TABLET | Freq: Four times a day (QID) | ORAL | Status: DC
  Administered 2022-01-07 – 2022-01-08 (×5): 600 mg via ORAL
  Filled 2022-01-07 (×7): qty 1

## 2022-01-07 MED ORDER — DIBUCAINE (PERIANAL) 1 % EX OINT: 1.0000 | TOPICAL_OINTMENT | CUTANEOUS | Status: DC | PRN

## 2022-01-07 MED ORDER — METHYLERGONOVINE MALEATE 0.2 MG/ML IJ SOLN: 0.2000 mg | Freq: Four times a day (QID) | INTRAMUSCULAR | Status: DC

## 2022-01-07 MED ORDER — MEASLES, MUMPS & RUBELLA VAC IJ SOLR: 0.5000 mL | Freq: Once | INTRAMUSCULAR | Status: DC

## 2022-01-07 MED ORDER — ZOLPIDEM TARTRATE 5 MG PO TABS: 5.0000 mg | ORAL_TABLET | Freq: Every evening | ORAL | Status: DC | PRN

## 2022-01-07 MED ORDER — COCONUT OIL OIL: 1.0000 | TOPICAL_OIL | Status: DC | PRN

## 2022-01-07 MED ORDER — IBUPROFEN 800 MG PO TABS
800.0000 mg | ORAL_TABLET | Freq: Once | ORAL | Status: DC
Start: 2022-01-07 — End: 2022-01-07

## 2022-01-07 MED ORDER — FENTANYL CITRATE (PF) 100 MCG/2ML IJ SOLN
50.0000 ug | INTRAMUSCULAR | Status: DC | PRN
  Administered 2022-01-07: 50 ug via INTRAVENOUS
  Filled 2022-01-07 (×2): qty 2

## 2022-01-07 MED ORDER — PRENATAL MULTIVITAMIN CH: 1.0000 | ORAL_TABLET | Freq: Every day | ORAL | Status: DC

## 2022-01-07 MED ORDER — SENNOSIDES-DOCUSATE SODIUM 8.6-50 MG PO TABS
2.0000 | ORAL_TABLET | ORAL | Status: DC
  Administered 2022-01-07: 2 via ORAL
  Filled 2022-01-07 (×2): qty 2

## 2022-01-07 MED ORDER — ONDANSETRON HCL 4 MG PO TABS: 4.0000 mg | ORAL_TABLET | ORAL | Status: DC | PRN

## 2022-01-07 MED ORDER — DIPHENHYDRAMINE HCL 25 MG PO CAPS: 25.0000 mg | ORAL_CAPSULE | Freq: Four times a day (QID) | ORAL | Status: DC | PRN

## 2022-01-07 NOTE — Anesthesia Postprocedure Evaluation (Signed)
Anesthesia Post Note  Patient: Kendra Rose  Procedure(s) Performed: AN AD HOC LABOR EPIDURAL     Patient location during evaluation: Mother Baby Anesthesia Type: Epidural Level of consciousness: awake Pain management: satisfactory to patient Vital Signs Assessment: post-procedure vital signs reviewed and stable Respiratory status: spontaneous breathing Cardiovascular status: stable Anesthetic complications: no   No notable events documented.  Last Vitals:  Vitals:   01/07/22 0412 01/07/22 0741  BP: 120/68 111/70  Pulse: 79 91  Resp: 17 18  Temp: 37.2 C 37.2 C  SpO2: 100%     Last Pain:  Vitals:   01/07/22 0800  TempSrc:   PainSc: 3    Pain Goal:                   [employer]

## 2022-01-07 NOTE — Progress Notes (Signed)
Pt re-evaluated. Balloon deflated and no increased in bleeding. Suction removed from [facility] and no increase in bleeding. Jada removed after observation. After removal of Jada, some clots came out with it (about 100c in clots), I suspect  from the time of placement. No increased in bleeding. Monitored bleeding after this and no increased. Advised RN to empty bladder via straight cath. She will be monitored for 1-2 hours on L&D for stability and then may go to MB. Discussed with patient 24 hours of PO methergine to help with uterine tone. Labs to be checked around 5am.   Milas Hock, MD Attending Obstetrician & Gynecologist, [facility] for [facility], [facility]

## 2022-01-07 NOTE — Plan of Care (Signed)
  Problem: Education: Goal: Knowledge of General Education information will improve Description: Including pain rating scale, medication(s)/side effects and non-pharmacologic comfort measures 01/07/2022 0536 by Joretta Bachelor, RN Outcome: Progressing 01/06/2022 1922 by Joretta Bachelor, RN Outcome: Progressing   Problem: Health Behavior/Discharge Planning: Goal: Ability to manage health-related needs will improve 01/07/2022 0536 by Joretta Bachelor, RN Outcome: Progressing 01/06/2022 1922 by Joretta Bachelor, RN Outcome: Progressing   Problem: Clinical Measurements: Goal: Ability to maintain clinical measurements within normal limits will improve 01/07/2022 0536 by Joretta Bachelor, RN Outcome: Progressing 01/06/2022 1922 by Joretta Bachelor, RN Outcome: Progressing Goal: Will remain free from infection 01/07/2022 0536 by Joretta Bachelor, RN Outcome: Progressing 01/06/2022 1922 by Joretta Bachelor, RN Outcome: Progressing Goal: Diagnostic test results will improve 01/07/2022 0536 by Joretta Bachelor, RN Outcome: Progressing 01/06/2022 1922 by Joretta Bachelor, RN Outcome: Progressing Goal: Respiratory complications will improve 01/07/2022 0536 by Joretta Bachelor, RN Outcome: Progressing 01/06/2022 1922 by Joretta Bachelor, RN Outcome: Progressing Goal: Cardiovascular complication will be avoided 01/07/2022 0536 by Joretta Bachelor, RN Outcome: Progressing 01/06/2022 1922 by Joretta Bachelor, RN Outcome: Progressing   Problem: Activity: Goal: Risk for activity intolerance will decrease 01/07/2022 0536 by Joretta Bachelor, RN Outcome: Progressing 01/06/2022 1922 by Joretta Bachelor, RN Outcome: Progressing   Problem: Nutrition: Goal: Adequate nutrition will be maintained 01/07/2022 0536 by Joretta Bachelor, RN Outcome: Progressing 01/06/2022 1922 by Joretta Bachelor, RN Outcome: Progressing   Problem: Coping: Goal: Level of anxiety will decrease 01/07/2022 0536 by Joretta Bachelor, RN Outcome: Progressing 01/06/2022 1922 by Joretta Bachelor, RN Outcome: Progressing   Problem: Elimination: Goal: Will not experience complications related to bowel motility 01/07/2022 0536 by Joretta Bachelor, RN Outcome: Progressing 01/06/2022 1922 by Joretta Bachelor, RN Outcome: Progressing Goal: Will not experience complications related to urinary retention 01/07/2022 0536 by Joretta Bachelor, RN Outcome: Progressing 01/06/2022 1922 by Joretta Bachelor, RN Outcome: Progressing   Problem: Pain Managment: Goal: General experience of comfort will improve 01/07/2022 0536 by Joretta Bachelor, RN Outcome: Progressing 01/06/2022 1922 by Joretta Bachelor, RN Outcome: Progressing   Problem: Safety: Goal: Ability to remain free from injury will improve 01/07/2022 0536 by Joretta Bachelor, RN Outcome: Progressing 01/06/2022 1922 by Joretta Bachelor, RN Outcome: Progressing   Problem: Skin Integrity: Goal: Risk for impaired skin integrity will decrease 01/07/2022 0536 by Joretta Bachelor, RN Outcome: Progressing 01/06/2022 1922 by Joretta Bachelor, RN Outcome: Progressing

## 2022-01-07 NOTE — Progress Notes (Signed)
Called to see patient due to bleeding in s/o lower uterine segment atony.   Medicines given to that point: TXA at delivery, pitocin postpartum, miso 800 (400 buccal, 400 PR), and methergine x1.   Philipp Deputy, CNM requested my input and placement of Jada.   I did bimanual and while fundus was firm, lower uterine segment with clots again. These were removed and the Mel Almond was placed. Suction applied. Balloon inflated with 120 cc NS.   Hemabate x1 also given.   Bleeding monitoring for about 15 minutes following Jada placement and minimal bleeding around the balloon and fundus was firm.   Will recheck labs in about 4 hours as she is currently stable (VSS) with minimal bleeding and total EBL to this point was 600cc.   Will reassess Jada around 1:30am.   Milas Hock, MD Attending Obstetrician & Gynecologist, [facility] for [facility], [facility]

## 2022-01-07 NOTE — Discharge Summary (Signed)
Postpartum Discharge Summary  Date of Service updated***     Patient Name: Kendra Rose DOB: 06-Jan-2001 MRN: [id number]  Date of admission: 01/06/2022 Delivery date:01/06/2022  Delivering provider: Serita Grammes D  Date of discharge: 01/07/2022  Admitting diagnosis: Encounter for induction of labor [Z34.90] Intrauterine pregnancy: [redacted]w[redacted]d     Secondary diagnosis:  Principal Problem:   Encounter for induction of labor Active Problems:   History of postpartum hemorrhage   Gestational hypertension   Acute blood loss anemia   Postpartum hemorrhage  Additional problems: PPH (    Discharge diagnosis: Term Pregnancy Delivered, Gestational Hypertension, and PPH                                              Post partum procedures:{Postpartum procedures:23558} Augmentation: AROM and Pitocin Complications: None  Hospital course: Induction of Labor With Vaginal Delivery   21 y.o. yo G2P1001 at [redacted]w[redacted]d was admitted to the hospital 01/06/2022 for induction of labor.  Indication for induction: Elective.  Patient had an uncomplicated labor course as follows: Membrane Rupture Time/Date: 5:29 PM ,01/06/2022   Delivery Method:Vaginal, Spontaneous  Episiotomy: None  Lacerations:    Details of delivery can be found in separate delivery note.  Patient had a postpartum course remarkable for receiving a dose of IV Venofer as her PPD#1 Hgb was 8.3. Her BPs postpartum have been *** Patient is discharged home 01/07/22.  Newborn Data: Birth date:01/06/2022  Birth time:11:28 PM  Gender:Female  Living status:Living  Apgars:8 ,9  Weight:3487 g (7lb 11oz)  Magnesium Sulfate received: No BMZ received: No Rhophylac:N/A MMR:N/A T-DaP: unsure Flu: No Transfusion:{Transfusion received:30440034}  Physical exam  Vitals:   01/07/22 0231 01/07/22 0315 01/07/22 0412 01/07/22 0741  BP: (!) 121/53 127/86 120/68 111/70  Pulse: 96 78 79 91  Resp:  $Remo'18 17 18  'xoQey$ Temp:  98.5 F (36.9 C) 99 F (37.2 C) 99 F  (37.2 C)  TempSrc:  Oral Oral Oral  SpO2:  99% 100%   Weight:      Height:       General: {Exam; general:21111117} Lochia: {Desc; appropriate/inappropriate:30686::"appropriate"} Uterine Fundus: {Desc; firm/soft:30687} Incision: {Exam; incision:21111123} DVT Evaluation: {Exam; dvt:2111122} Labs: Lab Results  Component Value Date   WBC 14.7 (H) 01/07/2022   HGB 8.3 (L) 01/07/2022   HCT 25.5 (L) 01/07/2022   MCV 78.9 (L) 01/07/2022   PLT 131 (L) 01/07/2022      Latest Ref Rng & Units 01/06/2022    7:12 AM  CMP  Glucose 70 - 99 mg/dL 84   BUN 6 - 20 mg/dL 7   Creatinine 0.44 - 1.00 mg/dL 0.46   Sodium 135 - 145 mmol/L 137   Potassium 3.5 - 5.1 mmol/L 3.6   Chloride 98 - 111 mmol/L 105   CO2 22 - 32 mmol/L 21   Calcium 8.9 - 10.3 mg/dL 9.1   Total Protein 6.5 - 8.1 g/dL 7.0   Total Bilirubin 0.3 - 1.2 mg/dL 0.5   Alkaline Phos 38 - 126 U/L 130   AST 15 - 41 U/L 26   ALT 0 - 44 U/L 16    Edinburgh Score:    10/10/2019   12:25 PM  Edinburgh Postnatal Depression Scale Screening Tool  I have been able to laugh and see the funny side of things. 0  I have looked forward with enjoyment to  things. 0  I have blamed myself unnecessarily when things went wrong. 1  I have been anxious or worried for no good reason. 0  I have felt scared or panicky for no good reason. 1  Things have been getting on top of me. 2  I have been so unhappy that I have had difficulty sleeping. 0  I have felt sad or miserable. 1  I have been so unhappy that I have been crying. 1  The thought of harming myself has occurred to me. 0  Edinburgh Postnatal Depression Scale Total 6     After visit meds:  Allergies as of 01/07/2022   No Known Allergies   Med Rec must be completed prior to using this [facility]***        Discharge home in stable condition Infant Feeding: {Baby feeding:23562} Infant Disposition:{CHL IP OB HOME WITH FPKGYB:71278} Discharge instruction: per After Visit Summary and  Postpartum booklet. Activity: Advance as tolerated. Pelvic rest for 6 weeks.  Diet: routine diet Future Appointments: Future Appointments  Date Time Provider [place]  02/16/2022  1:10 PM Truett Mainland, DO [facility] None   Follow up Visit:  Myrtis Ser, CNM  P [facility] Admin Please schedule this patient for Postpartum visit in: (already has appt w JS) with the following provider: MD  In-Person  For C/S patients schedule nurse incision check in weeks 2 weeks: no  High risk pregnancy complicated by: gHTN, [place]  Delivery mode:  SVD  Anticipated Birth Control:  IUD (couldn't place post-placentally due to [facility])  PP Procedures needed: BP check in 1wk  Schedule Integrated [place] visit: no   01/07/2022 Myrtis Ser, CNM

## 2022-01-07 NOTE — Progress Notes (Signed)
Post Partum Day #1 Subjective: Was able to ambulate without difficulty, although she had an initial spell of dizziness when first standing; bottlefeeding; is requesting another dose of pain medication  Objective: Blood pressure 111/70, pulse 91, temperature 99 F (37.2 C), temperature source Oral, resp. rate 18, height 5\' 4"  (1.626 m), weight 96.8 kg, last menstrual period 03/21/2021, SpO2 100 %.  Physical Exam:  General: alert, cooperative, and no distress Lochia: appropriate Uterine Fundus: firm DVT Evaluation: No evidence of DVT seen on physical exam.  Recent Labs    01/06/22 0701 01/07/22 0449  HGB 11.1* 8.3*  HCT 33.7* 25.5*    Assessment/Plan: Plan for discharge tomorrow Will give a dose of IV Venofer today   LOS: 1 day   01/09/22, CNM 01/07/2022, 8:26 AM

## 2022-01-08 MED ORDER — IBUPROFEN 600 MG PO TABS: 600.0000 mg | ORAL_TABLET | Freq: Four times a day (QID) | ORAL | 0 refills | Status: AC

## 2022-01-08 MED ORDER — ACETAMINOPHEN 325 MG PO TABS
650.0000 mg | ORAL_TABLET | ORAL | 0 refills | Status: AC | PRN
Start: 2022-01-08 — End: 2022-02-07

## 2022-01-11 ENCOUNTER — Encounter (HOSPITAL_COMMUNITY): Payer: Self-pay | Admitting: Family Medicine

## 2022-01-11 NOTE — Progress Notes (Signed)
Fent 50 mcg IV wasted with Biagio Quint RN (forgot to waste day of delivery, RN returned medication and wasted with 2nd nurse verification).

## 2022-01-16 ENCOUNTER — Telehealth (HOSPITAL_COMMUNITY): Payer: Self-pay | Admitting: *Deleted

## 2022-01-16 NOTE — Telephone Encounter (Signed)
Voice mailbox is full. Unable to leave message.   Duffy Rhody, RN 01-16-2022 at 9:39am

## 2022-02-16 ENCOUNTER — Ambulatory Visit: Payer: Medicaid Other | Admitting: Family Medicine

## 2022-03-15 DIAGNOSIS — Z30011 Encounter for initial prescription of contraceptive pills: Secondary | ICD-10-CM | POA: Diagnosis not present

## 2022-03-15 DIAGNOSIS — Z23 Encounter for immunization: Secondary | ICD-10-CM | POA: Diagnosis not present

## 2022-11-23 DIAGNOSIS — F909 Attention-deficit hyperactivity disorder, unspecified type: Secondary | ICD-10-CM | POA: Diagnosis not present

## 2023-02-19 DIAGNOSIS — Z79899 Other long term (current) drug therapy: Secondary | ICD-10-CM | POA: Diagnosis not present

## 2023-02-19 DIAGNOSIS — F909 Attention-deficit hyperactivity disorder, unspecified type: Secondary | ICD-10-CM | POA: Diagnosis not present

## 2023-03-21 DIAGNOSIS — Z79899 Other long term (current) drug therapy: Secondary | ICD-10-CM | POA: Diagnosis not present
